# Patient Record
Sex: Female | Born: 1985 | Race: White | Hispanic: No | Smoking: Never smoker
Health system: Southern US, Academic
[De-identification: ages and names within clinical notes are randomized; demographics above are authoritative.]

## PROBLEM LIST (undated history)

## (undated) DIAGNOSIS — E039 Hypothyroidism, unspecified: Secondary | ICD-10-CM

## (undated) DIAGNOSIS — I10 Essential (primary) hypertension: Secondary | ICD-10-CM

## (undated) DIAGNOSIS — N200 Calculus of kidney: Secondary | ICD-10-CM

## (undated) DIAGNOSIS — M1990 Unspecified osteoarthritis, unspecified site: Secondary | ICD-10-CM

## (undated) DIAGNOSIS — M539 Dorsopathy, unspecified: Secondary | ICD-10-CM

## (undated) DIAGNOSIS — E785 Hyperlipidemia, unspecified: Secondary | ICD-10-CM

## (undated) DIAGNOSIS — Z973 Presence of spectacles and contact lenses: Secondary | ICD-10-CM

## (undated) DIAGNOSIS — D689 Coagulation defect, unspecified: Secondary | ICD-10-CM

## (undated) DIAGNOSIS — G8929 Other chronic pain: Secondary | ICD-10-CM

## (undated) DIAGNOSIS — F419 Anxiety disorder, unspecified: Secondary | ICD-10-CM

## (undated) DIAGNOSIS — M329 Systemic lupus erythematosus, unspecified: Secondary | ICD-10-CM

## (undated) DIAGNOSIS — T753XXA Motion sickness, initial encounter: Secondary | ICD-10-CM

## (undated) HISTORY — PX: HX ENDOMETRIAL ABLATION: 2100001129

## (undated) HISTORY — PX: HX PELVIC LAPAROSCOPY: SHX162

## (undated) HISTORY — PX: LAPAROSCOPIC CHOLECYSTECTOMY: SUR755

## (undated) NOTE — Progress Notes (Signed)
 Formatting of this note might be different from the original.  Subjective   Patient ID: Jennifer Lindsey is a 15 y.o. female presenting to the Urgent Care with a chief complaint of Foot Pain (R foot pain after playing basketball yesterday).    Patient complains of lateral right foot pain after playing basketball last night.    History provided by:  Patient    Objective   BP 124/78 (BP Location: Right arm, Patient Position: Sitting, BP Cuff Size: Adult)   Pulse 77   Temp 36.9 C (98.5 F) (Oral)   Resp 16   Ht 1.651 m (5' 5)   Wt 61.2 kg (135 lb)   LMP  (Approximate)   SpO2 98%   BMI 22.47 kg/m     Physical Exam  Vitals and nursing note reviewed.   Constitutional:       Appearance: Normal appearance. She is normal weight.   Musculoskeletal:         General: Tenderness and signs of injury present.      Comments: Tenderness on lateral aspect of right foot   Neurological:      Mental Status: She is alert.         Assessment & Plan    Assessment & Plan  Acute pain of right foot    Orders:    XR foot 3+ views right        In-House Lab Results:   No results found for this or any previous visit.     In-House Imaging Reads:    3 views of right foot reviewed and show no acute findings.      Procedure Documentation:  Procedures     ED Course & MDM   MDM - Medical Decision Making: Home with return precautions  Electronically signed by Avel Larve, PA-C at 06/01/2024  8:49 AM EDT

---
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
## 2004-12-10 ENCOUNTER — Other Ambulatory Visit (HOSPITAL_COMMUNITY): Payer: Self-pay

## 2005-05-06 ENCOUNTER — Ambulatory Visit (INDEPENDENT_AMBULATORY_CARE_PROVIDER_SITE_OTHER): Payer: Self-pay | Admitting: Rheumatology

## 2005-12-08 ENCOUNTER — Ambulatory Visit (INDEPENDENT_AMBULATORY_CARE_PROVIDER_SITE_OTHER): Payer: Self-pay | Admitting: Rheumatology

## 2005-12-31 ENCOUNTER — Encounter (FREE_STANDING_LABORATORY_FACILITY): Payer: Self-pay | Admitting: Pathology

## 2006-06-22 ENCOUNTER — Ambulatory Visit (INDEPENDENT_AMBULATORY_CARE_PROVIDER_SITE_OTHER): Payer: Self-pay | Admitting: Rheumatology

## 2006-07-20 ENCOUNTER — Ambulatory Visit (HOSPITAL_COMMUNITY): Payer: Self-pay

## 2006-07-21 ENCOUNTER — Other Ambulatory Visit: Payer: Self-pay | Admitting: FAMILY PRACTICE

## 2006-07-21 ENCOUNTER — Ambulatory Visit (INDEPENDENT_AMBULATORY_CARE_PROVIDER_SITE_OTHER): Payer: Self-pay | Admitting: Rheumatology

## 2006-12-14 ENCOUNTER — Encounter (FREE_STANDING_LABORATORY_FACILITY): Admit: 2006-12-14 | Discharge: 2006-12-14 | Disposition: A | Discharge status: Home / Self Care | Payer: Self-pay

## 2006-12-20 ENCOUNTER — Encounter (INDEPENDENT_AMBULATORY_CARE_PROVIDER_SITE_OTHER): Payer: Self-pay | Admitting: Rheumatology

## 2006-12-20 ENCOUNTER — Ambulatory Visit
Admission: RE | Admit: 2006-12-20 | Discharge: 2006-12-20 | Disposition: A | Discharge status: Home / Self Care | Payer: No Typology Code available for payment source | Attending: Rheumatology | Admitting: Rheumatology

## 2006-12-20 DIAGNOSIS — M2550 Pain in unspecified joint: Secondary | ICD-10-CM

## 2006-12-20 DIAGNOSIS — I7381 Erythromelalgia: Secondary | ICD-10-CM

## 2006-12-20 DIAGNOSIS — I7300 Raynaud's syndrome without gangrene: Secondary | ICD-10-CM

## 2007-01-23 ENCOUNTER — Encounter (FREE_STANDING_LABORATORY_FACILITY): Admit: 2007-01-23 | Discharge: 2007-01-23 | Disposition: A | Discharge status: Home / Self Care | Payer: Self-pay

## 2007-01-23 DIAGNOSIS — N390 Urinary tract infection, site not specified: Secondary | ICD-10-CM | POA: Diagnosis present

## 2007-03-30 ENCOUNTER — Encounter (INDEPENDENT_AMBULATORY_CARE_PROVIDER_SITE_OTHER): Payer: No Typology Code available for payment source | Admitting: Rheumatology

## 2007-03-30 ENCOUNTER — Other Ambulatory Visit: Payer: Self-pay | Admitting: FAMILY PRACTICE

## 2007-03-30 DIAGNOSIS — I7381 Erythromelalgia: Secondary | ICD-10-CM

## 2007-07-05 ENCOUNTER — Encounter (INDEPENDENT_AMBULATORY_CARE_PROVIDER_SITE_OTHER): Payer: No Typology Code available for payment source | Admitting: Rheumatology

## 2007-07-24 ENCOUNTER — Ambulatory Visit
Admission: RE | Admit: 2007-07-24 | Discharge: 2007-07-24 | Disposition: A | Discharge status: Home / Self Care | Payer: No Typology Code available for payment source | Attending: FAMILY PRACTICE | Admitting: FAMILY PRACTICE

## 2007-07-24 ENCOUNTER — Encounter (INDEPENDENT_AMBULATORY_CARE_PROVIDER_SITE_OTHER): Payer: No Typology Code available for payment source | Admitting: Rheumatology

## 2007-07-24 DIAGNOSIS — Z79899 Other long term (current) drug therapy: Secondary | ICD-10-CM | POA: Insufficient documentation

## 2007-07-24 DIAGNOSIS — I7300 Raynaud's syndrome without gangrene: Secondary | ICD-10-CM

## 2007-07-24 DIAGNOSIS — Z23 Encounter for immunization: Secondary | ICD-10-CM

## 2007-08-29 LAB — CBC/DIFF
BASOPHILS: 1 % (ref 0–1)
BASOS ABS: 0.049 10*3/uL (ref 0.0–0.2)
EOS ABS: 0.046 THOU/uL — ABNORMAL LOW (ref 0.1–0.3)
EOSINOPHIL: 1 % (ref 1–6)
HCT: 43.3 % (ref 33.5–45.2)
HGB: 14.5 g/dL (ref 11.2–15.2)
LYMPHOCYTES: 26 % (ref 20–45)
LYMPHS ABS: 1.31 10*3/uL (ref 1.0–4.8)
MCH: 29.7 pg (ref 27.4–33.0)
MCHC: 33.6 g/dL (ref 31.6–35.5)
MCV: 88.5 fL (ref 78–100)
MONOCYTES: 8 % (ref 4–13)
MONOS ABS: 0.403 10*3/uL (ref 0.1–0.9)
MPV: 8.3 FL (ref 7.4–10.4)
PLATELET COUNT: 234 10*3/uL (ref 140–450)
PMN ABS: 3.22 10*3/uL (ref 1.3–7.7)
PMN'S: 64 % (ref 40–75)
RBC: 4.89 MIL/uL (ref 3.84–5.04)
RDW: 11.1 % — ABNORMAL LOW (ref 11.5–14.5)
WBC: 5 10*3/uL (ref 3.5–11.0)

## 2007-08-29 LAB — CRYOFIBRINOGEN,PLASMA: CRYOFIBRINOGEN: NEGATIVE

## 2007-08-29 LAB — CREATININE
CREATININE: 0.73 mg/dL (ref 0.49–1.10)
ESTIMATED GLOMERULAR FILTRATION RATE: >59 mL/min/{1.73_m2} (ref >59)

## 2007-08-29 LAB — SEDIMENTATION RATE: SEDIMENTATION RATE: 2 mm/h (ref 0–20)

## 2007-11-15 ENCOUNTER — Ambulatory Visit
Admission: RE | Admit: 2007-11-15 | Discharge: 2007-11-15 | Disposition: A | Discharge status: Home / Self Care | Payer: No Typology Code available for payment source | Attending: Rheumatology | Admitting: Rheumatology

## 2007-11-15 ENCOUNTER — Ambulatory Visit (INDEPENDENT_AMBULATORY_CARE_PROVIDER_SITE_OTHER): Payer: No Typology Code available for payment source | Admitting: Rheumatology

## 2007-11-15 DIAGNOSIS — M2550 Pain in unspecified joint: Secondary | ICD-10-CM

## 2007-11-15 LAB — ANTI-DOUBLE STRANDED DNA AB: ANTI-DNA AB: NEGATIVE

## 2007-11-15 LAB — CBC/DIFF
BASOPHILS: 1 % (ref 0–1)
BASOS ABS: 0.032 10*3/uL (ref 0.0–0.2)
EOS ABS: 0.133 10*3/uL (ref 0.1–0.3)
EOSINOPHIL: 3 % (ref 1–6)
HCT: 42 % (ref 33.5–45.2)
HGB: 13.7 g/dL (ref 11.2–15.2)
LYMPHS ABS: 1.29 10*3/uL (ref 1.0–4.8)
MCH: 29.2 pg (ref 27.4–33.0)
MCHC: 32.5 g/dL (ref 31.6–35.5)
MCV: 89.7 fL (ref 78–100)
MONOCYTES: 9 % (ref 4–13)
MONOS ABS: 0.396 10*3/uL (ref 0.1–0.9)
MPV: 7.8 FL (ref 7.4–10.4)
PLATELET COUNT: 239 10*3/uL (ref 140–450)
PMN ABS: 2.37 10*3/uL (ref 1.3–7.7)
RBC: 4.68 MIL/uL (ref 3.84–5.04)
RDW: 11.6 % (ref 11.5–14.5)
WBC: 4.2 THOU/UL (ref 3.5–11.0)

## 2007-11-15 LAB — URINALYSIS, MACROSCOPIC
BILIRUBIN: NEGATIVE
GLUCOSE: NEGATIVE mg/dL
KETONES: NEGATIVE mg/dL
NITRITE: NEGATIVE
PH URINE: 6 (ref 5.0–8.0)
PROTEIN: 30 mg/dL — AB
SPECIFIC GRAVITY, URINE: >=1.03 (ref 1.005–1.030)
UROBILINOGEN: 0.2 mg/dL

## 2007-11-15 LAB — ALT (SGPT): ALT (SGPT): 12 U/L (ref 6–35)

## 2007-11-15 LAB — URINALYSIS, MICROSCOPIC
BACTERIA: NORMAL /HPF (ref 0–493.0)
CASTS: NONE SEEN /LPF (ref 0–2.5)
RBC'S: 10 /HPF (ref 0–3.8)
WBC'S: 0 /HPF (ref 0–5.6)

## 2007-11-15 LAB — CREATININE
CREATININE: 0.8 mg/dL (ref 0.49–1.10)
ESTIMATED GLOMERULAR FILTRATION RATE: >59 mL/min/{1.73_m2} (ref >59)

## 2007-11-15 LAB — C4 COMPLEMENT, SERUM: C4: 13 mg/dL — ABNORMAL LOW (ref 20–59)

## 2007-11-15 LAB — SEDIMENTATION RATE: SEDIMENTATION RATE: 0 mm/hr (ref 0–20)

## 2007-11-15 LAB — AST (SGOT): AST (SGOT): 21 U/L (ref 5–30)

## 2007-11-15 LAB — C-REACTIVE PROTEIN(CRP),INFLAMMATION: C-REACTIVE PROTEIN HIGH SENSITIVITY (INFLAMMATION): 0.02 mg/dL (ref <0.800)

## 2007-11-15 LAB — C3 COMPLEMENT, SERUM: C3: 82 mg/dL — ABNORMAL LOW (ref 86–184)

## 2007-11-15 MED ORDER — OXAPROZIN 600 MG TABLET
1200.0000 mg | ORAL_TABLET | Freq: Every day | ORAL | Status: DC
Start: 2007-11-15 — End: 2015-12-22

## 2007-12-01 NOTE — Progress Notes (Signed)
[facility]  Physician [facility]  [address]  [place], [place] [address]  [phone]    PROGRESS NOTE    PATIENT NAME: Jennifer Lindsey, Jennifer Lindsey  CHART NUMBER: [id number]  DATE OF BIRTH: 06-29-1986  DATE OF SERVICE: 11/15/2007    SUBJECTIVE: The patient returns today to follow up. She complains that she has not been doing well. She relates that in late 08/2007 while home in [place], she developed knots on her knuckles and toes. She had these for about 2 weeks associated with joint pain. She basically hurt all over. She went to the emergency room and was given Toradol, Medrol Dosepak. Her symptoms improved over the course of about 3 days. About 2 weeks ago she noted knots on her fingers again. She again notes joint pain. She also says that when she gets these knots on her PIPs and toes, she will get a rash below the nodules on the dorsum of her fingers and on the tops of her toes. She discontinued Plaquenil in December because she did not think it was helping. She is taking 15 mg of Mobic today and she does not feel it helps. She in the past has tried ibuprofen and naproxen. The patient tells me she went to the [facility] after this episode in December for evaluation. She said they did blood work and she was told that everything was normal. She tells me that she was told that it "might be in my head."    OBJECTIVE: Temperature was 36.4 degrees centigrade, her pulse was 80, her respiratory rate was 16, and her blood pressure was 110/72. Her weight is 112.6 pounds. She is in no acute distress. She did not have any facial rash. Conjunctivae were clear. She had no frank synovitis in the hands, wrists, elbows, knees, ankles or feet. I really could not appreciate any rash on her hands nor could I do not really appreciate any nodules. She did feel that she had some on a couple of PIPs, but she did say that they are improving. She did note some residual rash over the right fifth toe and I did note some slight  hyperpigmentation with very minimal scale in that area.    LABORATORY DATA: Studies from 07/24/2007 were reviewed. Her white count was 5, her hemoglobin was 14.5. Her platelet count was 237,000. Sedimentation rate was 2 mm per hour, creatinine was 0.73 and her cryofibrinogen was negative.    ASSESSMENT:  1. Raynaud phenomenon by history.  2. Arthralgias with complaints of nodules and rash occurring intermittently of unclear etiology.  3. 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    PLAN: We will switch her Mobic to Daypro 1200 mg in the morning. We will check labs today and will call her with the results.      Alwyn Pea, MD  Associate Professor, Section of Rheumatology  [facility]    [id number]; D: 11/15/2007 10:40:15; T: 11/18/2007 10:05:27    cc: Emilie Rutter MD   [facility] [address]   [place], [place] [address]

## 2007-12-06 ENCOUNTER — Ambulatory Visit
Admission: RE | Admit: 2007-12-06 | Discharge: 2007-12-06 | Disposition: A | Discharge status: Home / Self Care | Payer: No Typology Code available for payment source | Attending: Rheumatology | Admitting: Rheumatology

## 2007-12-06 DIAGNOSIS — R319 Hematuria, unspecified: Secondary | ICD-10-CM | POA: Insufficient documentation

## 2007-12-06 LAB — URINALYSIS, MACROSCOPIC
BILIRUBIN: NEGATIVE
BLOOD: NEGATIVE
GLUCOSE: NEGATIVE mg/dL
KETONES: NEGATIVE mg/dL
LEUKOCYTES: NEGATIVE
NITRITE: NEGATIVE
PH URINE: 5.5 (ref 5.0–8.0)
PROTEIN: NEGATIVE mg/dL
SPECIFIC GRAVITY, URINE: 1.025 (ref 1.005–1.030)
UROBILINOGEN: 0.2 mg/dL

## 2007-12-08 LAB — URINE CULTURE,ROUTINE
CULTURE OBSERVATION: NO GROWTH
REPORT STATUS: 4032009

## 2008-02-29 ENCOUNTER — Encounter (INDEPENDENT_AMBULATORY_CARE_PROVIDER_SITE_OTHER): Payer: No Typology Code available for payment source | Admitting: Rheumatology

## 2008-03-19 ENCOUNTER — Encounter (FREE_STANDING_LABORATORY_FACILITY)
Admit: 2008-03-19 | Discharge: 2008-03-19 | Disposition: A | Discharge status: Home / Self Care | Payer: No Typology Code available for payment source | Attending: PHYSICIAN ASSISTANT | Admitting: PHYSICIAN ASSISTANT

## 2008-03-19 DIAGNOSIS — R110 Nausea: Secondary | ICD-10-CM | POA: Diagnosis present

## 2008-03-19 DIAGNOSIS — R42 Dizziness and giddiness: Secondary | ICD-10-CM | POA: Diagnosis present

## 2008-03-19 LAB — CBC/DIFF
BASOPHILS: 1 % (ref 0–1)
BASOS ABS: 0.043 THOU/uL (ref 0.0–0.2)
EOS ABS: 0.16 THOU/uL (ref 0.1–0.3)
EOSINOPHIL: 2 % (ref 1–6)
HCT: 39.9 % (ref 33.5–45.2)
HGB: 13.2 g/dL (ref 11.2–15.2)
LYMPHOCYTES: 25 % (ref 20–45)
LYMPHS ABS: 1.82 THOU/uL (ref 1.0–4.8)
MCH: 29.4 pg (ref 27.4–33.0)
MCHC: 32.9 g/dL (ref 31.6–35.5)
MCV: 89.4 fL (ref 78–100)
MONOCYTES: 8 % (ref 4–13)
MONOS ABS: 0.607 THOU/uL (ref 0.1–0.9)
MPV: 8.3 FL (ref 7.4–10.4)
PLATELET COUNT: 231 THO/UL (ref 140–450)
PMN ABS: 4.7 THOU/uL (ref 1.3–7.7)
PMN'S: 64 % (ref 40–75)
RBC: 4.47 MIL/uL (ref 3.84–5.04)
RDW: 11.9 % (ref 11.5–14.5)
WBC: 7.3 THOU/UL (ref 3.5–11.0)

## 2008-03-19 LAB — COMPREHENSIVE METABOLIC PANEL, NON-FASTING
ANION GAP: 7 mmol/L (ref 5–16)
BUN: 12 mg/dL (ref 6–20)
CALCIUM: 9.2 mg/dL (ref 8.5–10.4)
CARBON DIOXIDE: 27 mmol/L (ref 22–32)
CHLORIDE: 105 mmol/L (ref 96–111)
CREATININE: 0.69 mg/dL (ref 0.49–1.10)
ESTIMATED GLOMERULAR FILTRATION RATE: >59 ml/min/1.73m2 (ref >59)
GLUCOSE,NONFAST: 88 mg/dL (ref 65–139)
POTASSIUM: 4 mmol/L (ref 3.5–5.1)
SODIUM: 139 mmol/L (ref 136–145)

## 2008-05-02 ENCOUNTER — Encounter (INDEPENDENT_AMBULATORY_CARE_PROVIDER_SITE_OTHER): Payer: No Typology Code available for payment source | Admitting: Rheumatology

## 2008-05-02 ENCOUNTER — Ambulatory Visit
Admission: RE | Admit: 2008-05-02 | Discharge: 2008-05-02 | Disposition: A | Discharge status: Home / Self Care | Payer: No Typology Code available for payment source | Attending: Rheumatology | Admitting: Rheumatology

## 2008-05-02 DIAGNOSIS — M2550 Pain in unspecified joint: Secondary | ICD-10-CM | POA: Insufficient documentation

## 2008-05-02 DIAGNOSIS — R6889 Other general symptoms and signs: Secondary | ICD-10-CM

## 2008-05-02 DIAGNOSIS — R894 Abnormal immunological findings in specimens from other organs, systems and tissues: Secondary | ICD-10-CM | POA: Insufficient documentation

## 2008-05-02 DIAGNOSIS — E039 Hypothyroidism, unspecified: Secondary | ICD-10-CM | POA: Insufficient documentation

## 2008-05-02 LAB — URINALYSIS, MICROSCOPIC
RBC'S: 1 /HPF (ref 0–4)
WBC'S: 2 /HPF (ref 0–6)

## 2008-05-02 LAB — URINALYSIS, MACROSCOPIC
BILIRUBIN: NEGATIVE
BLOOD: NEGATIVE
GLUCOSE: NEGATIVE mg/dL
KETONES: NEGATIVE mg/dL
LEUKOCYTES: NEGATIVE
NITRITE: NEGATIVE
PH URINE: 6.5 (ref 5.0–8.0)
SPECIFIC GRAVITY, URINE: 1.022 (ref 1.005–1.030)
UROBILINOGEN: NORMAL mg/dL

## 2008-05-02 LAB — RHEUMATOID FACTOR, SERUM: RHEUMATOID FACTOR: <20 [IU]/mL (ref <20)

## 2008-05-02 LAB — SEDIMENTATION RATE: SEDIMENTATION RATE: 0 mm/h (ref 0–20)

## 2008-05-02 LAB — THYROID STIMULATING HORMONE (SENSITIVE TSH): TSH: 5.729 u[IU]/mL (ref 0.300–5.900)

## 2008-05-03 LAB — C4 COMPLEMENT, SERUM: C4: 12 mg/dL — ABNORMAL LOW (ref 20–59)

## 2008-05-03 LAB — ANTI-DOUBLE STRANDED DNA AB: ANTI-DNA AB: 1:10 {titer}

## 2008-05-03 LAB — C3 COMPLEMENT, SERUM: C3: 84 mg/dL — ABNORMAL LOW (ref 86–184)

## 2008-05-03 LAB — HEP-2 SUBSTRATE ANTINUCLEAR ANTIBODIES (ANA), SERUM: ANTI-NUCLEAR AB: NEGATIVE

## 2008-05-05 LAB — SS-A/RO ANTIBODIES, IGG, SERUM: ANTI-SSA AB, QUANTITATIVE: 6 U

## 2008-05-05 LAB — SM (SMITH) ANTIBODIES, IGG, SERUM: ANTI-SM AB, QUANTITATIVE: 1 U

## 2008-05-05 LAB — SS-B/LA ANTIBODIES, IGG, SERUM: ANTI-SSB AB, QUANTITATIVE: 0 U

## 2008-05-05 LAB — RNP ANTIBODIES, IGG, SERUM: RIBOSOMAL PROTEIN AB, IGG (U1) QUANTITATIVE: 3 U

## 2008-05-07 LAB — CYCLIC CITRULLINATED PEPTIDE ANTIBODIES, IGG, SERUM: CYCLIC CITRULLINATED PEP IgG: 13

## 2008-05-09 ENCOUNTER — Ambulatory Visit (INDEPENDENT_AMBULATORY_CARE_PROVIDER_SITE_OTHER): Payer: Self-pay | Admitting: Rheumatology

## 2008-05-09 NOTE — Telephone Encounter (Signed)
Triage Queue message copied by Ileene Hutchinson on Thu May 09, 2008 12:35 PM  ------   Message from: Durenda Age   Created: Thu May 09, 2008 11:05 AM    >> Durenda Age Thu May 09, 2008 11:05 am  DR.hORNSBY  PT NEEDS LAB RESULTS THAT WERE DONE LAST Thursday.PLEASE CALL PT BACK [phone] IF NO ANSWER PLEASE LEAVE MESSAGE

## 2008-05-10 MED ORDER — CYCLOBENZAPRINE 10 MG TABLET
10.00 mg | ORAL_TABLET | Freq: Every evening | ORAL | Status: DC
Start: 2008-05-10 — End: 2008-09-12

## 2008-05-10 NOTE — Telephone Encounter (Signed)
Gave pt results  Sleeps okay but not rested  Query FMS  Will try flexeril 5-10 mg qhs  She will call with response  Mail pt copy of labs please

## 2008-05-10 NOTE — Telephone Encounter (Signed)
Copy of labs mailed to pt.

## 2008-05-17 ENCOUNTER — Ambulatory Visit (INDEPENDENT_AMBULATORY_CARE_PROVIDER_SITE_OTHER): Payer: Self-pay | Admitting: Rheumatology

## 2008-05-17 ENCOUNTER — Encounter (INDEPENDENT_AMBULATORY_CARE_PROVIDER_SITE_OTHER): Payer: Self-pay | Admitting: Rheumatology

## 2008-05-17 NOTE — Telephone Encounter (Signed)
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 Any questions please call patient at [phone].

## 2008-05-17 NOTE — Telephone Encounter (Signed)
done

## 2008-05-20 ENCOUNTER — Ambulatory Visit (INDEPENDENT_AMBULATORY_CARE_PROVIDER_SITE_OTHER): Payer: Self-pay | Admitting: Rheumatology

## 2008-05-20 NOTE — Telephone Encounter (Signed)
Triage Queue message copied by Baker Pierini on Mon May 20, 2008 1:22 PM  ------   Message from: Venia Carbon   Created: Mon May 20, 2008 1:18 PM    >> Venia Carbon Mon May 20, 2008 1:18 pm  DR [facility]  PT NEEDS THE LETTER FOR [employer] FAXED TO [phone], ATTENTION CLARKE RIDGWAY. PLEASE CALL THE PT WHEN THIS IS FAXED.

## 2008-05-20 NOTE — Telephone Encounter (Signed)
Letter printed  Will leave at nurses station desk

## 2008-05-21 NOTE — Progress Notes (Signed)
[facility]  Physician [facility]  [address]  [place], [place] [address]  [phone]    PROGRESS NOTE    PATIENT NAME: Jennifer Lindsey, Jennifer Lindsey  CHART NUMBER: [id number]  DATE OF BIRTH: 04/14/1986  DATE OF SERVICE: 05/02/2008    SUBJECTIVE: Ms. Jennifer Lindsey presents today to follow up her history of Raynaud's and erythromelalgia, as well as a positive ANA. She notes continued diffuse joint pain. She says the Daypro 600 mg two a day helps, but she still has diffuse discomfort. She feels that her PIP joints occasionally gets swollen. She notes that she has diffuse swelling in her hands when they are dependent. Her Raynaud's is unchanged. She was out in the sun and had some blisters on her hands as well as a rash on her nose and face with sun exposure. The redness in her entire hand is better with cooling down and warmth makes it worse.    OBJECTIVE: Physical exam revealed a temperature of 37 degrees centigrade, pulse of 80, respiratory rate of 16 and a blood pressure of 124/76. Weight 126.4 pounds. She is in no acute distress. She did not have any fibromyalgia tender points. She had no active synovitis in the upper or lower extremities. Lungs were clear. Heart was regular.    ASSESSMENT:   1. Polyarthralgia.  2. Positive ANA.  3. Erythromelalgia  4. Raynaud's.    PLAN: We will reevaluate with laboratory studies and I will call her with the results. We will also check her thyroid as she now has a new diagnosis of hypothyroidism and is due to have her TSH rechecked.      Alwyn Pea, MD  Associate Professor, Section of Rheumatology  [facility] Department of Medicine    [id number]; D: 05/02/2008 15:56:33; T: 05/06/2008 05:47:26    cc: Enclosure: Lab Results          Emilia Beck DO   [address]    [place], [place] [address]

## 2008-05-21 NOTE — Telephone Encounter (Signed)
 Letter faxed to [employer] and message left on pts voicemail.

## 2008-08-12 ENCOUNTER — Encounter (INDEPENDENT_AMBULATORY_CARE_PROVIDER_SITE_OTHER): Payer: No Typology Code available for payment source | Admitting: Rheumatology

## 2008-09-12 ENCOUNTER — Other Ambulatory Visit (INDEPENDENT_AMBULATORY_CARE_PROVIDER_SITE_OTHER): Payer: Self-pay | Admitting: Rheumatology

## 2008-09-12 MED ORDER — CYCLOBENZAPRINE 10 MG TABLET
10.00 mg | ORAL_TABLET | Freq: Every evening | ORAL | Status: AC
Start: 2008-09-12 — End: ?

## 2008-09-12 NOTE — Telephone Encounter (Signed)
Triage Queue message copied by Baker Pierini on Thu Sep 12, 2008 7:55 AM  ------   Message from: Gibson Ramp   Created: Wed Sep 11, 2008 3:42 PM    >> Ali Lowe Sep 11, 2008 3:42 pm  Dr.Hornsby  The pt is requesting that you call in an rx for flexeril 10mg , she takes the meds 1 time per day

## 2008-12-18 ENCOUNTER — Encounter (INDEPENDENT_AMBULATORY_CARE_PROVIDER_SITE_OTHER): Payer: No Typology Code available for payment source | Admitting: Rheumatology

## 2008-12-18 ENCOUNTER — Encounter (INDEPENDENT_AMBULATORY_CARE_PROVIDER_SITE_OTHER): Payer: Self-pay | Admitting: Rheumatology

## 2008-12-18 ENCOUNTER — Ambulatory Visit (INDEPENDENT_AMBULATORY_CARE_PROVIDER_SITE_OTHER): Payer: No Typology Code available for payment source | Admitting: Rheumatology

## 2008-12-18 ENCOUNTER — Ambulatory Visit
Admission: RE | Admit: 2008-12-18 | Discharge: 2008-12-18 | Disposition: A | Discharge status: Home / Self Care | Payer: No Typology Code available for payment source | Attending: Rheumatology | Admitting: Rheumatology

## 2008-12-18 DIAGNOSIS — R21 Rash and other nonspecific skin eruption: Secondary | ICD-10-CM | POA: Insufficient documentation

## 2008-12-18 DIAGNOSIS — M2550 Pain in unspecified joint: Secondary | ICD-10-CM

## 2008-12-18 LAB — C-REACTIVE PROTEIN(CRP),INFLAMMATION: C-REACTIVE PROTEIN (CRP),INFLAMMATION: 0.102 mg/dL (ref <0.800)

## 2008-12-18 LAB — RHEUMATOID FACTOR, SERUM: RHEUMATOID FACTOR: <20 [IU]/mL (ref <20)

## 2008-12-18 LAB — SEDIMENTATION RATE: SEDIMENTATION RATE: 5 mm/h (ref 0–20)

## 2008-12-19 LAB — C4 COMPLEMENT, SERUM: C4: 18 mg/dL — ABNORMAL LOW (ref 20–59)

## 2008-12-19 LAB — C3 COMPLEMENT, SERUM: C3: 114 mg/dL (ref 86–184)

## 2008-12-19 NOTE — Progress Notes (Signed)
Subjective  Jennifer Lindsey is a 23 y.o. year old female who presents for Follow-up   to clinic. The patient complains of diffuse joint discomfort. Proximally one month ago she developed a fever of 102 and was admitted in [place] for suspected pyelonephritis. She was treated with Bactrim. At that time she also had associated joint pain. She saw her primary care provider after discharge and was given Valtrex and prednisone 5 mg a day for about 1-1/2 weeks. This helped her symptoms after 2 days. She has been off of the prednisone for about 2-1/2 weeks. She still has joint pain but it is less severe. She is not currently having any fevers. Her joint pain is located in her hands, wrists, lateral thighs and ankles. She also feels that her great toes and her PIP joints well. She denies morning stiffness. She has noted a rash on her hands today. She notes she developed a rash on her second PIP. She does not think the Daypro significantly helped. She says there is no change in her erythromelalgia or Raynaud's phenomenon.     Objective    Vitals: BP 130/90  Pulse 90  Temp 36.5 C (97.7 F)  Ht 1.664 m (5' 5.5")  Wt 56.337 kg (124 lb 3.2 oz)  SpO2 100%  General:  no acute distress  Eyes: Conjunctiva clear.  Joints:  Full ROM and no synovitis of the upper or lower extremities  Skin: There were small approximately 2 mm red papules on her hands.    Laboratory studies dated 313 2010 showed white count of 9.6, hemoglobin 13.8 and a platelet count 158,000. Glucose was 100, creatinine was 0.8, C. reactive protein was 5.1 and CK was 65. TSH was 1.32. Urinalysis showed small blood and 30 mg per deciliter protein. Urine culture was negative after 5 days  Assessment/Plan  Encounter Diagnoses   Code Name Primary? Qualifier   . 782.1R Rash     . 719.40A Joint pain       #1 Raynaud's   #2 erythromelalgia   #3 arthralgias   #4 papular rash  #5 mild elevation in diastolic blood pressure    Etiology of patient's symptoms remain somewhat  unclear. Laboratory studies will be performed today to evaluate further. She will see a dermatologist at home for her rash.      Orders Placed This Encounter   . Sedimentation rate   . C-reactive protein (inflammation)   . C3 complement   . C4 complement   . Anti-ssa ab   . Anti-ssb ab   . Ana   . Anti-rnp antibody   . Rheumatoid factor   . Cyclic citrullinated pep igg         Alexis Frock, MD

## 2008-12-20 LAB — HEP-2 SUBSTRATE ANTINUCLEAR ANTIBODIES (ANA), SERUM: ANTI-NUCLEAR AB: POSITIVE — AB

## 2008-12-21 LAB — SS-B/LA ANTIBODIES, IGG, SERUM: ANTI-SSB AB, QUANTITATIVE: 0 U

## 2008-12-21 LAB — SS-A/RO ANTIBODIES, IGG, SERUM: ANTI-SSA AB, QUANTITATIVE: 3 U

## 2008-12-21 LAB — CYCLIC CITRULLINATED PEPTIDE ANTIBODIES, IGG, SERUM: CYCLIC CITRULLINATED PEP IgG: 6

## 2008-12-21 LAB — RNP ANTIBODIES, IGG, SERUM: RIBOSOMAL PROTEIN AB, IGG (U1) QUANTITATIVE: 0 U

## 2008-12-30 ENCOUNTER — Ambulatory Visit (INDEPENDENT_AMBULATORY_CARE_PROVIDER_SITE_OTHER): Payer: No Typology Code available for payment source | Admitting: Rheumatology

## 2008-12-30 ENCOUNTER — Ambulatory Visit
Admission: RE | Admit: 2008-12-30 | Discharge: 2008-12-30 | Disposition: A | Discharge status: Home / Self Care | Payer: No Typology Code available for payment source | Attending: Rheumatology | Admitting: Rheumatology

## 2008-12-30 VITALS — BP 112/70 | HR 84 | Temp 98.6°F | Resp 16 | Ht 66.0 in | Wt 125.2 lb

## 2008-12-30 DIAGNOSIS — R21 Rash and other nonspecific skin eruption: Secondary | ICD-10-CM | POA: Insufficient documentation

## 2008-12-30 DIAGNOSIS — R894 Abnormal immunological findings in specimens from other organs, systems and tissues: Secondary | ICD-10-CM

## 2008-12-30 DIAGNOSIS — M2550 Pain in unspecified joint: Secondary | ICD-10-CM

## 2008-12-30 NOTE — Progress Notes (Signed)
Subjective  Jennifer Lindsey is a 23 y.o. year old female who presents for Results   to clinic. Patient here to followup her rashes and joint pain positive ANA. She now says she has a rash on the bottoms of both feet which stings a little bit when she gets in the shower. Otherwise she doesn't have any new symptoms since her last visit. 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 She will be moving to [place]. Skin biopsy suggested chilblains.    Objective    Vitals: BP 112/70  Pulse 84  Temp 37 C (98.6 F)  Resp 16  Ht 1.676 m (5\' 6" )  Wt 56.79 kg (125 lb 3.2 oz)  SpO2 94%  General:  no acute distress  Eyes: Conjunctiva clear.  Joints:  Full ROM and no synovitis  Skin: Slightly red raised maculopapular rash in the arches of her feet.  Psychiatric: Alert  Neuro: Grossly intact  Gait: No limp    Assessment/Plan  Encounter Diagnoses   Code Name Primary? Qualifier   . 782.1R Rash Yes    . 719.40A Joint pain     . 795.79AP Positive ANA (antinuclear antibody)       Plan lengthy discussion with the patient and her laboratory studies. We reviewed the fact that her ANA was positive. In the past she did have a trace positive cryofibrinogen and will repeated was negative. She's having symptoms again we have decided to recheck this. She tried Plaquenil before and didn't think that helped. We are considering retrying this for possible chilblains. Will also check her double-stranded DNA    Orders Placed This Encounter   . Cryofibrinogen   . Cryoglobulin   . Anti-double stranded dna ab         Alexis Frock, MD

## 2008-12-31 LAB — ANTI-DOUBLE STRANDED DNA AB: ANTI-DNA AB: 1:10 {titer}

## 2009-01-02 ENCOUNTER — Ambulatory Visit (INDEPENDENT_AMBULATORY_CARE_PROVIDER_SITE_OTHER): Payer: Self-pay | Admitting: Rheumatology

## 2009-01-02 LAB — CRYOFIBRINOGEN,PLASMA: CRYOFIBRINOGEN: POSITIVE — AB

## 2009-01-02 NOTE — Telephone Encounter (Signed)
Triage Queue message copied by Ileene Hutchinson on Thu Jan 02, 2009 2:42 PM  ------   Message from: Burnell Blanks A   Created: Thu Jan 02, 2009 2:30 PM    >> Burnell Blanks A Thu Jan 02, 2009 2:30 pm  Dr Tenna Child pt would like a call regarding her blood work results [facility]

## 2009-01-03 NOTE — Telephone Encounter (Signed)
Called pt  Left message I called

## 2009-01-03 NOTE — Telephone Encounter (Signed)
Gave her results  Recommend hematology evaluation to complete evaluation  The cryofibrinogen is possibly related to an autoimmune disease but other etiologies are possible  She will check to see if someone in her area is available to see her for that  If not she will call next week for referral here

## 2009-01-06 LAB — CRYOGLOBULIN,SERUM: CRYOGLOBULIN: NEGATIVE — AB

## 2009-01-07 ENCOUNTER — Ambulatory Visit (INDEPENDENT_AMBULATORY_CARE_PROVIDER_SITE_OTHER): Payer: Self-pay | Admitting: Rheumatology

## 2009-01-07 ENCOUNTER — Encounter (INDEPENDENT_AMBULATORY_CARE_PROVIDER_SITE_OTHER): Payer: No Typology Code available for payment source | Admitting: Rheumatology

## 2009-01-07 NOTE — Telephone Encounter (Signed)
Triage Queue message copied by Ileene Hutchinson on Tue Jan 07, 2009 12:52 PM  ------   Message from: Ace Gins   Created: Tue Jan 07, 2009 12:46 PM    >> Ace Gins Tue Jan 07, 2009 12:46 pm  DR HORNSBY-Patient is calling to let you know that she is going to see Dr Lynden Ang hemotoogist in [place]. She wouldl ike to talk to Dr Tenna Child because she is expierencing a rash across her face and torso. Please call. Thank you

## 2009-01-10 NOTE — Telephone Encounter (Signed)
Rash on nose and cheeks for three days  Resolved on its on  She will call if it recurs

## 2009-01-13 ENCOUNTER — Ambulatory Visit (INDEPENDENT_AMBULATORY_CARE_PROVIDER_SITE_OTHER): Payer: Self-pay | Admitting: Rheumatology

## 2009-01-13 NOTE — Telephone Encounter (Signed)
Triage Queue message copied by Ileene Hutchinson on Mon Jan 13, 2009 9:38 AM  ------   Message from: Gibson Ramp   Created: Fri Jan 10, 2009 3:51 PM    >> Gibson Ramp Fri Jan 10, 2009 3:51 pm  Dr.Hornsby  The pt is returning your cal, please call her at [phone]

## 2009-01-13 NOTE — Telephone Encounter (Signed)
Triage Queue message copied by Ileene Hutchinson on Mon Jan 13, 2009 1:32 PM  ------   Message from: Angelia Pennisi   Created: Mon Jan 13, 2009 9:58 AM    >> Angelia Fenlon ZOX Jan 13, 2009 9:58 am  Dr Tenna Child, the patient states that Dr Deretha Emory in [place] has been faxing a release form to get some of the patient's records, and they have not received anything back. She is wanting to know if you would take care of this if they send the release to the office fax, because med records is not getting back with Dr Deretha Emory. The patient's appointment is at 11:45 this morning, and they are needing this as soon as possible. If there are any questions the patient can be reached at [phone].

## 2009-01-13 NOTE — Telephone Encounter (Signed)
Called and spoke with pt.  Advised pt that we don't handle the ROI and we are unaware of the her requests.  Advised pt to contact medical records in regard to the ROI and getting the records sent to her PCP.  Pt verbalized understanding. Provided medical records phone number and transferred pt.

## 2009-01-29 ENCOUNTER — Ambulatory Visit (INDEPENDENT_AMBULATORY_CARE_PROVIDER_SITE_OTHER): Payer: Self-pay | Admitting: Rheumatology

## 2009-01-29 NOTE — Telephone Encounter (Signed)
Triage Queue message copied by Baker Pierini on Wed Jan 29, 2009 3:34 PM  ------   Message from: Angelia Hart   Created: Wed Jan 29, 2009 3:13 PM    >> Eldridge Abrahams Jan 29, 2009 3:13 pm  Dr Tenna Child, the patient states that she had testing done in [place] by her Onchcologist, and she was told to contact you to go over the results and find out where to go from there. The patient can be reached at [phone] or [phone].

## 2009-01-30 NOTE — Telephone Encounter (Signed)
Have results sent here

## 2009-01-31 NOTE — Telephone Encounter (Signed)
Called and spoke with pt.  Notified pt we don't have any records from her oncologist here but she can have the records faxed here for dr Tenna Child to go over provided fax number.

## 2009-02-04 NOTE — Telephone Encounter (Signed)
Records received via fax and placed in dr box for review

## 2009-02-11 ENCOUNTER — Ambulatory Visit (INDEPENDENT_AMBULATORY_CARE_PROVIDER_SITE_OTHER): Payer: Self-pay | Admitting: Rheumatology

## 2009-02-11 NOTE — Telephone Encounter (Signed)
Triage Queue message copied by Baker Pierini on Tue Feb 11, 2009 1:18 PM  ------   Message from: Sander Nephew   Created: Tue Feb 11, 2009 1:06 PM    >> Sander Nephew Tue Feb 11, 2009 1:06 pm  DR. HORNSBY: THE PATIENT IS CALLING REGARDING BLOOD WORK RESULTS SHE HAD DONE TWO WEEKS AGO IN [place] - SHE HAS NOT HEARD ANYTHING BACK FROM YOUR OFFICE, REGARDING THE RESULTS AND WOULD LIKE TO DISCUSS THIS ASAP. SHE CAN BE REACHED TODAY AT WORK NUMBER, THEN TRY CELL NUMBER

## 2009-02-11 NOTE — Telephone Encounter (Signed)
Her hematologist wants me to see if I can get her cryofibrinogen quantitaed in our lab and I am checking on that and will call her when I know the answer

## 2009-02-14 NOTE — Telephone Encounter (Signed)
No meds recommended yet pending the results of this test  She can get it now or wait, whatever is more convienent for her

## 2009-02-14 NOTE — Telephone Encounter (Signed)
Called and spoke with pt.  Notified pt dr Tenna Child doesn't recommend any meds until she gets the results of the test.  Pt stated she will wait until her appt June 21.

## 2009-02-24 ENCOUNTER — Ambulatory Visit
Admission: RE | Admit: 2009-02-24 | Discharge: 2009-02-24 | Disposition: A | Discharge status: Home / Self Care | Payer: No Typology Code available for payment source | Attending: Rheumatology | Admitting: Rheumatology

## 2009-03-03 LAB — CRYOFIBRINOGEN,PLASMA: CRYOFIBRINOGEN: NEGATIVE — AB

## 2009-03-04 ENCOUNTER — Ambulatory Visit (INDEPENDENT_AMBULATORY_CARE_PROVIDER_SITE_OTHER): Payer: Self-pay | Admitting: Rheumatology

## 2009-03-04 NOTE — Telephone Encounter (Signed)
Triage Queue message copied by Baker Pierini on Tue Mar 04, 2009 2:53 PM  ------   Message from: Michel Bickers   Created: Tue Mar 04, 2009 2:19 PM    >> Michel Bickers Tue Mar 04, 2009 2:19 pm  Dr Tenna Child,    Patient would like her blood work results.

## 2009-03-04 NOTE — Telephone Encounter (Signed)
Phoned pt and gave her Levan Hurst message.

## 2009-03-04 NOTE — Telephone Encounter (Signed)
Please call patient and tell her that the cryofibrinogen was negative so the first test did not mean anything. THanks

## 2009-03-31 ENCOUNTER — Encounter (INDEPENDENT_AMBULATORY_CARE_PROVIDER_SITE_OTHER): Payer: No Typology Code available for payment source | Admitting: Rheumatology

## 2011-02-13 NOTE — Letter (Signed)
[facility]  Physician [facility]  [address]  [place], [place] [address]  [phone]    PATIENT NAME: Jennifer Lindsey, Jennifer Lindsey  CHART NUMBER: [id number]  DATE OF BIRTH: 03-10-1986  DATE OF SERVICE: 05/17/2008    May 17, 2008      Jennifer Lindsey  [address]  [place], [place]  [address]    Dear Ms. Walko:     I am writing this letter at your request.  You have asked for a letter outlining your diagnosis and expected length of condition.    You have a history of Raynaud's phenomenon, erythromelalgia and a positive ANA.  You also have polyarthralgias and a diagnosis of hypothyroidism.  These are long-term diagnoses.    If you have any questions, please do not hesitate to contact me.    Sincerely yours,      Alwyn Pea, MD  Associate Professor, Section of Rheumatology  [facility] Department of Medicine    [id number]; D: 05/17/2008 10:32:18; T: 05/20/2008 09:42:33

## 2014-07-09 DIAGNOSIS — D8989 Other specified disorders involving the immune mechanism, not elsewhere classified: Secondary | ICD-10-CM | POA: Insufficient documentation

## 2014-07-09 DIAGNOSIS — N979 Female infertility, unspecified: Secondary | ICD-10-CM | POA: Insufficient documentation

## 2014-07-09 DIAGNOSIS — N971 Female infertility of tubal origin: Secondary | ICD-10-CM | POA: Insufficient documentation

## 2014-07-09 DIAGNOSIS — E039 Hypothyroidism, unspecified: Secondary | ICD-10-CM | POA: Insufficient documentation

## 2014-07-09 DIAGNOSIS — N809 Endometriosis, unspecified: Secondary | ICD-10-CM | POA: Insufficient documentation

## 2015-12-01 ENCOUNTER — Encounter (HOSPITAL_COMMUNITY): Payer: Self-pay

## 2015-12-01 NOTE — Ancillary Notes (Addendum)
[facility] Record for: [name], Jennifer Lindsey  Created: 11/28/2015 1:05:37 PM  MRN: [id number]  DOB: 09/25/1985  SSN: [id number]  Sex: Female  Height:  Feet  Inches - Weight:   Juanell Fairly Name:   Address:   [address]  [place]: [place], [place] [address]  E-Mail:   Day Phone: [phone] Night Phone:  Other Phone: [phone]  Phone comments: [phone] is a number to a pharmacy please check  Call Back time:   Authorized contact:   Intake Date: 11/28/2015 1:05:37 PM  PCP:    RefMD: Melida Quitter MD, Rosette Reveal Insurance: PEIA  Secondary Insurance:   Insurance Comments: PEIA on file ID  [id number]     -------Referral Assignment------  Where was the original referral directed?: Physician Practice  Was a specific reviewer requested?: Unassigned Referral  How was the reviewer selected?: Rotation  Who requested the specific reviewer?:   How did you hear of our spine program?:   Is this a second opinion?:      -------Web Challenge Info----------  Favorite Color:   City of Birth:      ---------- 1st Review ----------     Review Date:   Review Completed by:   Impression:   Disposition:   Appt w/ Colleague:   Colleague Name:   Appt How Soon:   Pre Treatment Type:   Pre Treatment Type Details:   Pre Treatment Other Test:   Appt Type:   Instructions:      ---------- Symptoms ----------     Chief complaint:   Diagnosis from Other MD:      Symptoms:   Other Symptom Description:   Pain Location:   Other Pain Location Description:   Where is your pain the worst?:   Pain Type:   Pain Rating:   Does the pain radiate to other parts of your body?    Radiate Where:   Other Description:   Does it radiate to the fingers?   Does it radiate below the elbow?   Which specific part of your arm?   Which fingers?   Which part of your arm does pain go to?   How does it radiate to the arm?   Does it radiate to the toes?   Does it radiate below the knee?   Which specific part of your leg?   Which toes?   Which part of your leg does pain go to?      How does it radiate to the leg?   Additional pain information      Location of Numbness/tingling:   Other Description:  Does numbness/tingling radiate to other parts of your body?:  Where does the numbess/tingling radiate?:  Other Description:  Does the numbness/tingling radiate below your elbow?:  Which specific part of your arm?:  Which fingers:  Which pat of your arm does the numbness/tingling go to?:  How does the numbness/tingling radiate to your arm?:  Does the numbness/tingling radiate below your knee?:  Which specific part of your leg?:  Which toes:  Which part of your leg does the numbness/tingling go to?:  How does the numbness/tingling radiate to your leg?:  Additional Numbness/tingling information:  Other Description:      Location of Weakness:   Other Description:   Additional Weakness Information:      The symptoms have been present for:   The symptoms began:   Was there a specific event that caused your symptoms?:   Additional Narrative Description:   Are you able  to perform your daily activities with these symptoms?:   Since what date have you been unable to perform your daily routine?:   The symptoms improve when you:   Other activities that improve your symptoms:   The symptoms worsen when you:   Other activities that worsen your symptoms:      ---------- Work History ----------     Are you able to work?:   Reasons for not working:   Other Reason for not working:   Do you have Work Restrictions:   If applicable, maximum lifting restriction:   How long have you been unable to work:   Have you ever filed a W/C claim related to a neck or back injury?:   Occupation:   Other Occupation Information:      ---------- Bowel/Bladder/Incontinence Issues ----------     Since the onset of symptoms, have you experienced any new problems urinating or having bowel movements?:   Description:   Other Description:   How long have you had these bowel/bladder problems?:      ---------- Allergies ----------     Do you  have any medication allergies?   Allergies:   Other Details:   Allergic to Latex?:   Allergic to intravenous contrast dyes?:   Allergic to steroids?:      ---------- Treatment/Testing ----------      Taking prescription medication for this problem?:   Are you using any now?:   Have you received a Medrol dose pack for this problem?:   When did you last take the dosepack?:   Were your symptoms improved?:   Would you describe your relief as:   How long did you experience that amount of relief?:   Other Medrol dosepack information:      --------------- PT ---------------     PT:   When Received:   Where Received:   Other where received information:   Visits:   Types:   Other Types:   Improved:      If improved, describe level of relief:   If improved, how long did you experience relief:   Other Physical Therapy Treatment Information:      ---------- Chiropractic Services ----------     Chiro:   When:   Who:   Visits:   Types:   Other Types:   Were Symptoms Improved:   If improved, describe level of relief:   If improved, how long did you experience relief:   Other Chiropractic Treatment Information:      --------------- ESI ---------------     ESI:   When:   Who:   Visits:   Types:   Improved:   If improved, describe level of relief:   If improved, how long did you experience relief:   Other Injection Treatment Information:      ---------- Diagnostic Tests ----------     MRI scan On:11/19/14 [facility]  Area Scanned: Thoracic  Abdomen MRI On:11/19/14 [facility]   Do you have any of the following in case an MRI is ordered?:   Other MRI factors:      ---------- Past Medical History ----------     What other doctors/providers have treated you for these spine issues?     Ever diagnosed with Spine deformity?:   Prior neck or back surgery (1)?:   When:   Who:   Area of neck/spine operated on:   Level:   Were symptoms improved?:   If improved, describe level of relief:  If improved,  how long did you experience relief?:   Prior neck or back surgery (2)?:   When:   Who:   Area of neck/spine operated on:   Level:   Were symptoms improved?:   If improved, describe level of relief:  If improved, how long did you experience relief?:   Prior neck or back surgery (3)?:   When:   Who:   Area of neck/spine operated on:   Level:   Were symptoms improved?:   If improved, describe level of relief:  If improved, how long did you experience relief?:   Do you have any of the following assistive devices?   How long have you required these assistive devices?   Currently being treated for any other medical condition?:   Conditions:   Other Conditions:   Type of neurologic disorder:   Cancer Type:   Other Treatment/Medication:   What blood thinners are you currently taking?:   Which physicians are treating you for your other medical conditions?:   Do you smoke?:   Are you pre-menopausal or post-menopausal?:   If recommended, are you willing to consider surgery?:   What is your goal in seeking treatment?:   Other pertinent information/ general comments/ goals for treatment: office notes and report on file DBragg     ---------- Care Coordinator Information ----------     Care Coordinator: RS  Activity Log:      Letter/Test Coordinator: Letters  Letter/Test Coordinator Log: 12/01/2015 8:51:43 AM Aggie Cosier Tephabock]: This referral needs done in the pain clinic per [facility]. I emailed it over to [employer]. I called and confirmed she is here. Ctephabock 12/04/2015 9:24:38 AM [Zarahi Fuerst]: Left message on Dr. Melida Quitter office answering machine to call back to clarify if the referral is for neurosurgery and for pain clinic. CChidester. 12/08/2015 12:06:58 PM Rosey Bath Prince]: Drema Dallas to check the statis of the appointment for the patient. She wasn't in her office I left a message for her to call us back. Tprince  12/08/2015 12:58:38 PM Rosey Bath Prince]: I received a message from Hilda Lias she states they have reached out to the  patient to schedule left a message for her to call back. Tprince  12/10/2015 10:39:30 AM [Trini Soldo]: Patient is scheduled with Dr. Felizardo Hoffmann at [facility] for 12/22/15 at 02:20 pm. CChidester.     Intake Specialist Comments: 11/28/2015 2:13:34 PM Rosey Bath Prince]: Attempted to contact patient to obtain his/her medical history.  Left message and phone  on answering machine for patient to return our call. First number had a voice mail that wasn't set up,second number was for a pharmacy. Will set to try again Monday. Scarlette Calico     Clinic Staff Comments:      Last Edited byJerolyn Shin on 12/10/2015 10:41:33 AM  Last Review by:  on

## 2015-12-22 ENCOUNTER — Encounter (HOSPITAL_BASED_OUTPATIENT_CLINIC_OR_DEPARTMENT_OTHER): Payer: Self-pay | Admitting: Anesthesiology

## 2015-12-22 ENCOUNTER — Ambulatory Visit: Payer: No Typology Code available for payment source | Attending: Anesthesiology | Admitting: Anesthesiology

## 2015-12-22 ENCOUNTER — Ambulatory Visit (HOSPITAL_BASED_OUTPATIENT_CLINIC_OR_DEPARTMENT_OTHER): Payer: No Typology Code available for payment source

## 2015-12-22 VITALS — BP 136/81 | HR 62 | Temp 98.1°F | Resp 20 | Ht 66.0 in | Wt 133.6 lb

## 2015-12-22 DIAGNOSIS — M47814 Spondylosis without myelopathy or radiculopathy, thoracic region: Secondary | ICD-10-CM

## 2015-12-22 DIAGNOSIS — Z7952 Long term (current) use of systemic steroids: Secondary | ICD-10-CM | POA: Insufficient documentation

## 2015-12-22 DIAGNOSIS — G894 Chronic pain syndrome: Secondary | ICD-10-CM

## 2015-12-22 DIAGNOSIS — M549 Dorsalgia, unspecified: Secondary | ICD-10-CM

## 2015-12-23 NOTE — Progress Notes (Addendum)
[facility]  [facility]  [address]  [address]  [place] [address]  [phone]               [facility] Controlled Substance Full Name Report Report Date 12/23/2015   From 09/07/2015 To 12/22/2015 Date of Birth [birth year] Prescription Count 0   Last Name Lindsey First Name Jennifer Middle Name                  M.E.D.D. Morphine Equivalent Daily Dose  [place] CSAPP/RxDataTrack Patient Reports  This is an Active Cumulative Morphine Equivalent score added to patient reports, when the patient has active prescriptions for opioids. This feature is meant to readily identify the potency comparison among different prescribed opioids as a single score describing the equivalent dose of morphine available to be taken on a daily basis. It is a guide ONLY.    According to CDC, a score of more than 50 requires caution and a score of 90 or more indicates a significant increase in the risk for an overdose.                  Patients included in report that appear to match the search criteria.   Last Name First Name Middle Name Gender Address           This table shows the prescriptions used to compute the MEDD score.           MEDD Score Calculator   RX# Drug Strength Unit  Multiplier  Quantity  Days  MEDD           Cumulative Active Morphine Equivalent                           0           It does not reflect any information on the use of any other type of controlled substance such as benzodiazepines, stimulants, or sedatives. if a patient does not have an active prescription for an opiate no score will be visible on the report.           Prescriber Name Prescriber DEA & Zip Dispenser Name Dispenser DEA & Zip Rx Written Date Rx Dispense Date Rx Number Product Name MEDD Status Strength Qty Days # of Refill Sched Payment Type              Date:12/23/2015  Name: Jennifer Lindsey  Date of Birth:[birth year]    Imagery:  Not available.    BOP:  Today.      Current  Medications (opioids/ being prescribed):    Current Outpatient Prescriptions   Medication Sig    cyclobenzaprine (FLEXERIL) 10 mg Tab take 1 Tab by mouth every night.  (Patient taking differently: Take 10 mg by mouth Every night Pt takes differently, pt takes 1/4 of tablet at night as needed.)    FLUoxetine (PROZAC) 40 mg Oral Capsule Take 40 mg by mouth Once a day    hydroxychloroquine (PLAQUENIL) 200 mg Oral Tablet Take 200 mg by mouth Once a day    levothyroxine (SYNTHROID) 50 mcg Tab Take 112 mcg by mouth Once a day     Norethindrone (Contraceptive) (ERRIN) 0.35 mg Tab take 1 Tab by mouth Once a day.      Allergies   Allergen Reactions    Estrogens  Other Adverse Reaction (Add comment)     Really high  BP    Penicillins Rash     History reviewed. No pertinent past medical history.    History reviewed. No pertinent surgical history.      History reviewed. No pertinent family history.      Social History     Social History    Marital status: Married     Spouse name: N/A    Number of children: N/A    Years of education: N/A     Social History Main Topics    Smoking status: Never Smoker    Smokeless tobacco: Never Used    Alcohol use No    Drug use: Not on file    Sexual activity: Not on file     Other Topics Concern    Not on file     Social History Narrative       Chief Complaint:  Back pain      HPI:  This patient is a 30 y.o. year old female who was referred for pain treatment for back pain. 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  She is having difficulty with the middle of the back over the past one year. She did see Dr. Melida QuitterMcCarthy and have injections - see media information.   She had three TMBB with over 75% reduction in pain.   He told her to come her to get the RFA.  She does have difficulty with raising her arms for longer periods of time at work.  She is standing for longer periods of time at work and this brings on spasms at times.   She has tried NSAIDs without any relief.  She  does not currently use a muscle relaxer but has in the past.   She needs to function for longer periods of time.   She wants to get the RFA done asap.     This patient has tried and failed conservative care in the form of modified activities, physical therapy and antiinflammatory medications and is currently on pain medications.  This patient is satisfied with the pain control currently.  This patient reports increased function.  This patient reports limited side effects including but not limited to constipation.   This patient is tolerating and overall satisfied with the current regimen.  This patient denies any changes in the medication, medical condition and allergies during the past interval.            ROS:  Review of Systems   Constitutional: Negative for fever. Negative for weight loss.   HENT: Negative.   Eyes: Negative.   Respiratory: Negative.   Cardiovascular: Negative for chest pain.   Gastrointestinal: Negative for abdominal pain and bowel incontinence.   Endocrine: Negative for heat intolerance.   Genitourinary: Negative. Negative for bladder incontinence, dysuria and pelvic pain.   Musculoskeletal: Positive for back pain.   Skin: Negative.   Neurological: Positive for tingling, weakness and numbness. Negative for headaches and paresthesias.   Psychiatric/Behavioral: Negative.     PHYSICAL EXAMINATION:BP 136/81   Pulse 62   Temp 36.7 C (98.1 F)   Resp 20   Ht 1.676 m (5\' 6" )   Wt 60.6 kg (133 lb 9.6 oz)   BMI 21.56 kg/m2    General: appears in good health, appears stated age and vital signs reviewed and discussed with patient  Eyes: Conjunctiva clear.  HENT:Head atraumatic and normocephalic  Neck: No thyromegaly or lymphadenopathy  Musculoskeletal:   Gait and Station:Normal  Musculoskeletal Tenderness: positive  Lungs: Clear to  auscultation bilaterally.   Cardiovascular: regular rate and rhythm, S1, S2 normal, no murmur, click, rub or gallop  Abdomen: Bowel sounds normal  Extremities: No  synovitis or joint effusions  Skin: Skin warm and dry  Neurologic:  Mental Status:  Alert, oriented x3. Cranial nerves 2-12 are grossly intact. Motor:  5/5 throughout.  Sensation: Intact to touch throughout.   Deep tendon reflexes: +2 throughout.     Muscular:  Cervical: Coordination finger/nose normal (pos)   heel/shin normal (pos)  Head: CNII-XII grossly intact (pos), Occipital Nerve WNL (pos)  Cervical ROM: Facets  CN2-12 GI  Normal ROM  Neg Spuring - yes which side  Neg Facet Loading  No Myofascial Tenderness  Normal Sensation to light touch   /4 Reflexes  /5 Strength  Normal Coordination- finger to nose  (neg) allodynia/ dysesthesia to light touch  (neg) pain on palpation spinal process  Thoracic - tenderness over the T10/11/12 bilateral  Lumbar:  Lumbar ROM normal  SLR (sit) (neg)  SLR (Supine) (neg)  Compression test positive  Fortin Finger Test positive  Trochan Bursa L  (neg) R  (neg)  Gluteal bursa L  (neg)R(neg) Lumbar Facet: (pos) (R/L)  Sacroiliac L(neg)     R (neg)   (neg) allodynia/ dysesthesia to light touch  (neg)pain on palpation spinal process  Gait: unremarkable / wide-based  /unsteady  / antalgic / ambulates without devices   Lymphatics: No lymphadenopathy  Psychiatric: Normal affect, behavior, memory, thought content, judgement, and speech.    Assessment:     ICD-10-CM    1. 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 Back pain, unspecified back location, unspecified back pain laterality, unspecified chronicity M54.9 PAIN CL FL FACET INJ CERVICAL THORACIC 2 LEVELS     FACET  INJ CERV THOR 2 LEVELS (AMB ONLY)   3. Chronic pain disorder G89.4 PAIN CL FL FACET INJ CERVICAL THORACIC 2 LEVELS     FACET  INJ CERV THOR 2 LEVELS (AMB ONLY)       Plan:  1.  PT.  2.  RFA Right and Left T10/11, T11/12   3.  Please schedule on April 26 and patient needs 2-3 hours travel time to get  here.  4.  Consider trigger point for spasms.        I spent time counseling the patient on narcotic safety and effects on the body.  The patient had an opportunity to ask questions and we discussed their care.   All questions were answered utilizing models, educational material and demonstration.      The patient was educated on the schedule injection including the risks/benefits and expected outcomes.  The patient had an opportunity to ask questions and we discussed their care.   All questions were answered utilizing models, educational material and demonstration.      Follow-up:  One month and/or after procedure.  The patient was instructed to return to our office if symptoms change, if worse - patient advised to seek care in an emergency room.  Patient verbalized and agreed with plan above.    Leim Fabry, NP, Ed.D, APRN, FNP-BC 12/22/2015, 16:41  Nurse Practitioner with [facility]  The patient was seen independently with the co-signing faculty present in clinic.

## 2016-01-13 ENCOUNTER — Encounter (INDEPENDENT_AMBULATORY_CARE_PROVIDER_SITE_OTHER): Payer: Self-pay | Admitting: Anesthesiology

## 2016-01-13 ENCOUNTER — Ambulatory Visit: Payer: No Typology Code available for payment source | Attending: Anesthesiology | Admitting: Anesthesiology

## 2016-01-13 DIAGNOSIS — M47814 Spondylosis without myelopathy or radiculopathy, thoracic region: Secondary | ICD-10-CM | POA: Insufficient documentation

## 2016-01-13 DIAGNOSIS — G894 Chronic pain syndrome: Secondary | ICD-10-CM

## 2016-01-13 DIAGNOSIS — M549 Dorsalgia, unspecified: Secondary | ICD-10-CM

## 2016-01-13 NOTE — Patient Instructions (Signed)
[facility]  [address]  [address]  [place] [id number]  Dept: [phone]  Dept Fax: [phone]  [phone]                                                 SPECIAL PROCEDURES                                     DISCHARGE FORM                                          [phone]      Please follow the instructions listed below for your procedures.  If you have questions concerning your procedure, you may call and leave a message.  Messages will be returned by the end of the next business day.  If you have an emergency, proceed to your local Emergency Department.      PROCEDURE: RFA     Do not drive a car or operate machinery until tomorrow.   Rest today and return to normal activities tomorrow.   If you are on restricted activities by your physician, please continue to follow these.  If you are not sure, contact your physician.   It is possible to experience mild numbness of the lower back and legs.  This is temporary.   If you have soreness at the injection site, the application of heat or ice may be helpful. Mild analgesics may also be used.   In case of severe headache; lie flat to decrease it.  Increase all fluids especially those with caffeine.  Mild analgesics are also appropriate.  If headache is not relieved by these measures, contact the Pain Clinic.   Steroid injections may cause temporary increase of blood sugar levels.    These instructions have been reviewed with the patient and appropriate questions have answers.  [name], RTR 01/13/2016 10:53

## 2016-01-13 NOTE — Procedures (Signed)
[facility]  [address]  [address]  [place] [id number]  [phone]        PATIENT NAME: Jennifer BowlValerie R Bevis  CHART VFIEPP:[id number]:[id number]  DATE OF BIRTH: May 21, 1986  DATE OF SERVICE:01/13/2016    SITE OF SERVICE  [facility].    PREOPERATIVE DIAGNOSIS:    Patient Active Problem List   Diagnosis    Hypertension    Back pain        POSTOPERATIVE DIAGNOSIS:  Same    NAME OF PROCEDURE: Bilateral  2 Level thoracic medial branch radiofrequency ablation.    SURGEON: Rodman Compichard Eilan Mcinerny, MD    ANESTHESIA: Local    ESTIMATED BLOOD LOSS:None    COMPLICATIONS:  None    DESCRIPTION OF PROCEDURE:  After informed consent was obtained, the patient was taken to the procedure room and placed in the prone position.  The lateral pillars at T10 and   T-11 and T-12 on the Bilateral were identified with fluoroscopy.  The neck had been prepped and draped in a sterile fashion.  Next, 25-gauge,  treatment needles were placed down to the lateral pillars at those levels with fluoroscopic visualization.  Sensory and motor stimulation were performed at each level and found to show reproduction of the patients pain with no stimulation of motor nerves in the neck or arm.  The needles were then injected with a mixture of steroids and local anesthetic consisting of Dexamethasone 4mg  (1ml) and 0.5% Sensorcaine with epinephrine. 0.585ml injected at level, each lesion made at each level at 80% centigrade for 90 seconds each.  The patient tolerated the procedure well.   Follow up as needed.      Rodman Compichard Diedra Sinor, MD 01/13/2016, 11:06  Assistant Professor  Department of Neurosurgery  Division of Pain Medicine

## 2016-02-06 ENCOUNTER — Encounter (INDEPENDENT_AMBULATORY_CARE_PROVIDER_SITE_OTHER): Payer: No Typology Code available for payment source | Admitting: Family

## 2016-03-08 ENCOUNTER — Ambulatory Visit: Payer: No Typology Code available for payment source | Attending: Anesthesiology | Admitting: Anesthesiology

## 2016-03-08 VITALS — BP 117/83 | HR 69 | Temp 97.2°F | Resp 20 | Ht 64.84 in | Wt 135.1 lb

## 2016-03-08 DIAGNOSIS — Z791 Long term (current) use of non-steroidal anti-inflammatories (NSAID): Secondary | ICD-10-CM | POA: Insufficient documentation

## 2016-03-08 DIAGNOSIS — M549 Dorsalgia, unspecified: Secondary | ICD-10-CM | POA: Insufficient documentation

## 2016-03-08 DIAGNOSIS — I10 Essential (primary) hypertension: Secondary | ICD-10-CM | POA: Insufficient documentation

## 2016-03-08 DIAGNOSIS — Z79891 Long term (current) use of opiate analgesic: Secondary | ICD-10-CM | POA: Insufficient documentation

## 2016-03-08 DIAGNOSIS — Z79899 Other long term (current) drug therapy: Secondary | ICD-10-CM | POA: Insufficient documentation

## 2016-03-08 NOTE — Progress Notes (Signed)
Defense and Veterans Pain Rating Scale    On a scale of 0-10, what is your overall pain Rating:6        On a scale of 0-10, during the past 24 hours, pain has interfered with you usual activity:   5    On a scale of 0-10, during the past 24 hours, pain has interfered with your sleep:  3    On a scale of 0-10, during the past 24 hours, pain has affected your mood:   2    On a scale of 0-10, during the past 24 hours, pain has contributed to your stress:   4  Date:03/08/2016    Chief Complaint:  Chief Complaint   Patient presents with    Back Pain     Severity:severe   Timing:daily  Palliative:nothing  Provocative:activity  Description:sharp  Duration:years    Last Procedure:rfa thoracic  Result:no help      Medication:  Current Outpatient Prescriptions   Medication Sig    cyclobenzaprine (FLEXERIL) 10 mg Tab take 1 Tab by mouth every night.  (Patient not taking: Reported on 03/08/2016)    DULoxetine (CYMBALTA) 30 mg Oral Capsule, Delayed Release(E.C.) Take 30 mg by mouth Once a day    FLUoxetine (PROZAC) 40 mg Oral Capsule Take 40 mg by mouth Once a day    hydroxychloroquine (PLAQUENIL) 200 mg Oral Tablet Take 200 mg by mouth Once a day    levothyroxine (SYNTHROID) 50 mcg Tab Take 112 mcg by mouth Once a day     meloxicam (MOBIC) 15 mg Oral Tablet Take 15 mg by mouth Once a day    methocarbamol (ROBAXIN) 500 mg Oral Tablet Take 500 mg by mouth Four times a day    Norethindrone (Contraceptive) (ERRIN) 0.35 mg Tab take 1 Tab by mouth Once a day.     traMADol (ULTRAM) 50 mg Oral Tablet Take 50 mg by mouth Every 6 hours as needed               Vitals:    03/08/16 1348   BP: 117/83   Pulse: 69   Resp: 20   Temp: 36.2 C (97.2 F)   TempSrc: Oral   SpO2: 100%   Weight: 61.3 kg (135 lb 2.3 oz)   Height: 1.647 m (5' 4.84")     There are no exam notes on file for this visit.      PFMSHx: I have reviewed and updated as appropriate the past medical, family and social history.    No past medical history on file.      No  past surgical history on file.      No family history on file.      Social History     Social History    Marital status: Married     Spouse name: N/A    Number of children: N/A    Years of education: N/A     Social History Main Topics    Smoking status: Never Smoker    Smokeless tobacco: Never Used    Alcohol use No    Drug use: Not on file    Sexual activity: Not on file     Other Topics Concern    Not on file     Social History Narrative     Expanded Substance History     Additional history           XLK:[id number]:Constitutional: negative, Eyes: negative, Ears, nose, mouth, throat, and face: negative,  Respiratory: negative, Gastrointestinal: negative, Musculoskeletal:negative except for HPI, Neurological: negative except for HPI and Behavioral/Psych: negative except for HPI    Exam:    Constitutional: BP 117/83   Pulse 69   Temp 36.2 C (97.2 F) (Oral)    Resp 20   Ht 1.647 m (5' 4.84")   Wt 61.3 kg (135 lb 2.3 oz)   SpO2 100%   BMI 22.6 kg/m2 Weight: 61.3 kg (135 lb 2.3 oz)   , General appearance unremarkable    Eyes: Conjunctiva and lids: normal    Head: NC/AT    Neck: Normal external inspection    Respiratory: normal effort    Cardiovascular: no JVD or peripheral cardiac edema    Abdomen: non tender    Neurological: DTR'S symmetrical                        Sensory intact                        Motor intact                          Psychiatric: Judgement normal                      Insight good                      Oriented times 3                      Memory intact                      Mood stable                      Affect normal    MSK:   Lumbar Cervical              Gait:            ROM:              SLR: equal           Spasm:              Strength: equal           Strength:               GON/LON:         Facet Pain: Facet Pain:              Facets Loading:           Facet Loading:              Spasm:           Spasm:               Pain to Palpation:           Pain to Palpation:              Axial back  pain:           Axial neck pain: pos              Trigger Points:           Trigger Points:pos    Radiculopathy: Radiculopathy:         SI Joint Med Follow-up  SI pain:            Pre med pain:              Provocation Test (1-3):           Post med pain:                        Gaenslen's:           Side effects:                        FABER:           ADL's:                                  Distraction:            Other:                        Compression:                Spondyloarthritis:               Fusion L5-S1:                                                             Myofascial:                                      History of onset of the painful condition, and its presumed cause:                                                              Distribution pattern of pain consistent with the referral pattern of the trigger                                             points;                                                              Restriction of range of motion with increased sensitivity to stretch;                                                             Muscular deconditioning in the affected area;  Focal tenderness of a trigger point;                                                     Palpable taut band of muscle in which trigger point is located;                                Assessment:  Patient Active Problem List   Diagnosis    Hypertension    Back pain           Plan:  Procedure:   Orders Placed This Encounter    TPI 3+ MUS W/O BOTOX, W/US (AMB ONLY)    US GUIDED TRIGGER POINT INJ WO BOTOX         [place]CSMP Checked:    Imaging:    Testing:    Referral:    New Prescription / Refill / Sample:    Follow up:                                                              If the patient is taking chronic opioids they were told not to put their medication in a medicine cabinet, to keep them hidden or preferably locked up, not  to discuss their use in public and not to share or use others medication.        Patient was referred for pain treatment. They have tried and failed conservative care in the form of modified activities, physical therapy and antiinflammatory medications or pain medications.                               Rodman Compichard Shawonda Kerce, MD 03/08/2016, 14:02

## 2016-04-14 ENCOUNTER — Encounter (INDEPENDENT_AMBULATORY_CARE_PROVIDER_SITE_OTHER): Payer: Self-pay | Admitting: Anesthesiology

## 2016-04-15 ENCOUNTER — Other Ambulatory Visit (INDEPENDENT_AMBULATORY_CARE_PROVIDER_SITE_OTHER): Payer: Self-pay | Admitting: Anesthesiology

## 2016-04-15 DIAGNOSIS — M549 Dorsalgia, unspecified: Secondary | ICD-10-CM

## 2016-04-19 ENCOUNTER — Encounter (INDEPENDENT_AMBULATORY_CARE_PROVIDER_SITE_OTHER): Payer: Self-pay | Admitting: Anesthesiology

## 2016-04-27 ENCOUNTER — Encounter (INDEPENDENT_AMBULATORY_CARE_PROVIDER_SITE_OTHER): Payer: Self-pay

## 2016-04-27 ENCOUNTER — Ambulatory Visit (INDEPENDENT_AMBULATORY_CARE_PROVIDER_SITE_OTHER): Payer: No Typology Code available for payment source | Admitting: Anesthesiology

## 2016-06-16 ENCOUNTER — Ambulatory Visit: Payer: No Typology Code available for payment source | Attending: Anesthesiology | Admitting: Anesthesiology

## 2016-06-16 DIAGNOSIS — M549 Dorsalgia, unspecified: Secondary | ICD-10-CM

## 2016-06-16 DIAGNOSIS — M546 Pain in thoracic spine: Secondary | ICD-10-CM | POA: Insufficient documentation

## 2016-06-16 NOTE — Patient Instructions (Signed)
[facility]  [address]  [place] [id number]  Dept: [phone]  Dept Fax: [phone]  [phone]                                                 SPECIAL PROCEDURES                                     DISCHARGE FORM                                          [phone]      Please follow the instructions listed below for your procedures.  If you have questions concerning your procedure, you may call and leave a message.  Messages will be returned by the end of the next business day.  If you have an emergency, proceed to your local Emergency Department.      PROCEDURE: TRIGGER POINT INJECTIONS     Do not drive a car or operate machinery until tomorrow.   Rest today and return to normal activities tomorrow.   If you are on restricted activities by your physician, please continue to follow these.  If you are not sure, contact your physician.   If you have soreness at the injection site, the application of heat or ice may be helpful. Mild analgesics may also be used.   Steroid injections may cause temporary increase of blood sugar levels.    These instructions have been reviewed with the patient and appropriate questions have answers.  Jerlyn LySara Freja Faro, LPN 02/72/536610/07/2016 44:[id number]:20

## 2016-06-16 NOTE — Procedures (Signed)
[facility]  [address]  [place] [id number]  [phone]      PATIENT NAME: Jennifer Lindsey  CHART BJYNWG:[id number]:[id number]  DATE OF BIRTH: 30-Aug-1986  DATE OF SERVICE:06/16/2016    SITE OF SERVICE  [facility]    PREOPERATIVE DIAGNOSIS:   Patient Active Problem List   Diagnosis    Hypertension    Back pain            POSTOPERATIVE DIAGNOSIS: Same      NAME OF PROCEDURE: Trigger Point Injection with Ultrasound    SURGEON: Rodman Compichard Yury Schaus, MD      ANESTHESIA: None    ESTIMATED BLOOD LOSS: None    COMPLICATIONS:  were no complications    DESCRIPTION OF PROCEDURE:       After informed consent was obtained, patient was placed in a seated position. Areas of most tenderness were palpated and marked.  The skin was prepped and draped in the usual sterile fashion using alcohol.  At each of the previously marked locations,  0.25% Bupivacaine was injected after negative aspiration in a fanning manner in various planes. There was either or both a local taut response to snapping palpation or needle   insertion; and reproduction of referred pain pattern upon stimulation   of the trigger point. There were no complications or paresthesias.Patient tolerated the procedure well and all needles were removed intact.    Ultrasound confirmation of muscles:thoracic paraspinal     Patient was observed in recovery and discharged home with written instructions under supervision in stable condition.    Rodman Compichard Romin Divita, MD 06/16/2016, 13:00  AssociateProfessor  Department of Anesthesiology and Psychiatry  Director Outpatient Pain Services  See  note for details. I saw and evaluated the patient and agree with the resident's findings and plan as written except as noted and I was present and supervised all procedures performed.    Rodman Compichard Any Mcneice MD  06/16/2016, 13:00

## 2016-06-16 NOTE — Progress Notes (Signed)
Pain and Function:     Defense and Veterans Pain Rating Scale     On a scale of 0-10, during the past 24 hours, pain has interfered with you usual activity: 6     On a scale of 0-10, during the past 24 hours, pain has interfered with your sleep: 5    On a scale of 0-10, during the past 24 hours, pain has affected your mood: 3     On a scale of 0-10, during the past 24 hours, pain has contributed to your stress: 6     On a scale of 0-10, what is your overall pain Rating: 5

## 2016-06-21 ENCOUNTER — Encounter (INDEPENDENT_AMBULATORY_CARE_PROVIDER_SITE_OTHER): Payer: Self-pay | Admitting: Anesthesiology

## 2016-07-03 ENCOUNTER — Other Ambulatory Visit: Payer: Self-pay

## 2016-07-05 ENCOUNTER — Encounter (INDEPENDENT_AMBULATORY_CARE_PROVIDER_SITE_OTHER): Payer: No Typology Code available for payment source | Admitting: NURSE PRACTITIONER

## 2016-07-09 ENCOUNTER — Encounter (INDEPENDENT_AMBULATORY_CARE_PROVIDER_SITE_OTHER): Payer: Self-pay | Admitting: Anesthesiology

## 2017-07-09 ENCOUNTER — Other Ambulatory Visit: Payer: Self-pay

## 2017-11-22 ENCOUNTER — Ambulatory Visit (HOSPITAL_COMMUNITY): Payer: Self-pay

## 2018-01-09 ENCOUNTER — Encounter (INDEPENDENT_AMBULATORY_CARE_PROVIDER_SITE_OTHER): Payer: No Typology Code available for payment source | Admitting: Gerontology

## 2018-06-13 ENCOUNTER — Encounter (INDEPENDENT_AMBULATORY_CARE_PROVIDER_SITE_OTHER): Payer: No Typology Code available for payment source | Admitting: Family

## 2019-05-03 NOTE — OR Surgeon (Signed)
[facility]  [address] [place], [place] [address]  [phone] or [phone]    C O N F I D E N T I A L   I N F O R M A T I O N  -------------------------------------------------------------------------------------------------------------------  STANDARD [facility] DOCUMENT  OPERATIVE REPORT Otolaryngology Head and Neck Surgery  Name:  Jennifer Lindsey  [facility] #:  [id number]  Date:  05/03/2019  Date of Birth:  09-14-1985    Pre Operative Diagnoses: Patulous Eustachian Tube   Post Operative Diagnoses:  Same    Incision/Procedure Start Time: 3:08 PM  Incision Close/Procedure End Time: 4:08 PM     Surgical Procedure:   1.Bilateral Injection of Eustachian tubes with Restalyne   2.Bilateral inferior turbinate outfracture    Primary Surgeon:  Jovita Gamma, MD  Resident Surgeon: Lysle Dingwall, MD  Anesthesia:  General anesthesia.    Findings: Bilateral eustachian tube orifices injected with 0.77mL of Restalyne.     Procedure Indications: Jennifer Lindsey is an 33 year old female with a history of patulous eustachian tube dysfunction who experienced autophony to voice and breathing. She has tried Patulend drops and vent tubes without improvement. Thus she presented to the OR for the above procedure.    Operative Note:   After bringing the patient back to the operating room a huddle was performed confirming the correct patient, procedure, allergies, and plan; all present, including the patient agreed. Patient was turned over the anesthesia care for induction of anesthesia.    Afrin was inserted into the nares bilaterally. 10mL of Lidocaine w/ epi were injected bilaterally. The inferior turbinates were outfractured bilaterally. The eustachian tube orrifice was visualized on the left side and Restalyne was injected into the eustachian tube cleft as superiorly as possible. In total 0.47mL was injected into the left eustachian tube orifice. The eustachian tube orrifice was visualized on the right side and Restalyne  was injected into the eustachian tube cleft as superiorly as possible. In total 0.23mL was injected into the right eustachian tube orifice.     The patient was transferred to anesthesia's care for wake up.      Jovita Gamma, MD was present and scrubbed for the entirety of the case and performed the critical components.    Specimens: * No specimens in log *    Implants: * No implants in log *    Drains: None    Complications: None    Blood Loss: 5mL      Lysle Dingwall, MD dictating an operative report for  Jovita Gamma, MD

## 2020-03-14 ENCOUNTER — Ambulatory Visit (INDEPENDENT_AMBULATORY_CARE_PROVIDER_SITE_OTHER): Payer: Self-pay | Admitting: Otolaryngology

## 2020-03-14 NOTE — Telephone Encounter (Signed)
Regarding: PATULOUS EUSTACHIAN TUBES   ----- Message from Eudelia Bunch sent at 03/14/2020 10:11 AM EDT -----  Dr. Conchita Paris office needs to know of any ENT Providers will see patient for Patulous Eustachian Tubes //   Please return call to Delano Metz @ office to advise at [facility]: [phone] //   Thank You.Marland Kitchen

## 2020-03-14 NOTE — Telephone Encounter (Signed)
Returned call to [facility] pt with Dr. Juline Patch on 04/02/20 at [facility] at 1:45 pm NPV. Toma Copier will let pt know, nothing further.

## 2020-04-02 ENCOUNTER — Ambulatory Visit (INDEPENDENT_AMBULATORY_CARE_PROVIDER_SITE_OTHER): Payer: Self-pay | Admitting: Otolaryngology

## 2020-09-26 ENCOUNTER — Other Ambulatory Visit (HOSPITAL_COMMUNITY): Payer: Self-pay

## 2020-09-26 LAB — EXTERNAL COVID-19 MOLECULAR RESULT: External 2019-n-CoV/SARS-CoV-2: POSITIVE — AB

## 2020-10-07 ENCOUNTER — Encounter (INDEPENDENT_AMBULATORY_CARE_PROVIDER_SITE_OTHER): Payer: Self-pay | Admitting: Hematology & Oncology

## 2021-10-08 ENCOUNTER — Other Ambulatory Visit: Payer: Self-pay

## 2021-10-08 MED ORDER — TRETINOIN 0.05 % TOPICAL CREAM
TOPICAL_CREAM | CUTANEOUS | 4 refills | Status: AC
Start: 2021-09-02 — End: ?
  Filled 2021-10-08: qty 40, 40d supply, fill #0
  Filled 2022-05-11: qty 40, 90d supply, fill #0

## 2021-10-08 MED ORDER — ONDANSETRON 4 MG DISINTEGRATING TABLET
ORAL_TABLET | ORAL | 0 refills | Status: DC
Start: 2021-08-20 — End: 2023-12-26
  Filled 2021-10-08: qty 21, 5d supply, fill #0
  Filled 2021-10-10: qty 21, 6d supply, fill #0

## 2021-10-08 MED ORDER — CLOTRIMAZOLE 1 % TOPICAL CREAM
TOPICAL_CREAM | CUTANEOUS | 0 refills | Status: DC
Start: 2021-07-22 — End: 2022-03-25

## 2021-10-08 MED ORDER — NORETHINDRONE (CONTRACEPTIVE) 0.35 MG TABLET
ORAL_TABLET | ORAL | 2 refills | Status: DC
Start: 2021-09-11 — End: 2023-11-25
  Filled 2021-10-22 – 2021-10-30 (×2): qty 84, 84d supply, fill #0
  Filled 2022-01-13: qty 84, 84d supply, fill #1

## 2021-10-08 MED ORDER — LIDOCAINE-PRILOCAINE 2.5 %-2.5 % TOPICAL CREAM
TOPICAL_CREAM | CUTANEOUS | 0 refills | Status: DC
Start: 2021-07-22 — End: 2023-11-25
  Filled 2022-05-11: qty 90, 3d supply, fill #0

## 2021-10-08 MED ORDER — AMLODIPINE 10 MG TABLET
ORAL_TABLET | ORAL | 0 refills | Status: AC
Start: 2021-07-22 — End: ?

## 2021-10-08 MED ORDER — LEVOTHYROXINE 112 MCG TABLET
ORAL_TABLET | ORAL | 0 refills | Status: DC
Start: 2021-07-22 — End: 2022-01-28
  Filled 2021-11-06: qty 90, 90d supply, fill #0

## 2021-10-08 MED ORDER — CITALOPRAM 40 MG TABLET
ORAL_TABLET | ORAL | 2 refills | Status: DC
Start: 2021-10-01 — End: 2022-01-28
  Filled 2021-11-06: qty 30, 30d supply, fill #0
  Filled 2021-12-15: qty 30, 30d supply, fill #1

## 2021-10-08 MED ORDER — BUSPIRONE 10 MG TABLET
ORAL_TABLET | ORAL | 0 refills | Status: DC
Start: 2021-07-22 — End: 2022-01-28
  Filled 2021-11-06: qty 90, 90d supply, fill #0

## 2021-10-08 MED ORDER — CYANOCOBALAMIN (VIT B-12) 1,000 MCG/ML INJECTION SOLUTION
INTRAMUSCULAR | 2 refills | Status: AC
Start: 2021-07-22 — End: ?
  Filled 2021-10-10: qty 3, 90d supply, fill #0
  Filled 2021-12-29: qty 3, 90d supply, fill #1
  Filled 2022-04-11: qty 3, 90d supply, fill #2

## 2021-10-08 MED ORDER — LIDOCAINE-PRILOCAINE 2.5 %-2.5 % TOPICAL CREAM
TOPICAL_CREAM | CUTANEOUS | 0 refills | Status: DC
Start: 2021-04-30 — End: 2022-03-25

## 2021-10-09 ENCOUNTER — Other Ambulatory Visit: Payer: Self-pay

## 2021-10-10 ENCOUNTER — Other Ambulatory Visit: Payer: Self-pay

## 2021-10-12 ENCOUNTER — Other Ambulatory Visit: Payer: Self-pay

## 2021-10-22 ENCOUNTER — Other Ambulatory Visit: Payer: Self-pay

## 2021-10-30 ENCOUNTER — Other Ambulatory Visit: Payer: Self-pay

## 2021-11-06 ENCOUNTER — Other Ambulatory Visit: Payer: Self-pay

## 2021-11-09 ENCOUNTER — Other Ambulatory Visit: Payer: Self-pay

## 2021-12-15 ENCOUNTER — Other Ambulatory Visit: Payer: Self-pay

## 2021-12-16 ENCOUNTER — Other Ambulatory Visit: Payer: Self-pay

## 2021-12-17 ENCOUNTER — Other Ambulatory Visit: Payer: Self-pay

## 2021-12-21 ENCOUNTER — Other Ambulatory Visit: Payer: Self-pay

## 2021-12-21 ENCOUNTER — Other Ambulatory Visit (HOSPITAL_COMMUNITY): Payer: Self-pay

## 2021-12-21 ENCOUNTER — Inpatient Hospital Stay
Admission: RE | Admit: 2021-12-21 | Discharge: 2021-12-21 | Disposition: A | Discharge status: Home / Self Care | Payer: No Typology Code available for payment source | Source: Ambulatory Visit

## 2021-12-21 DIAGNOSIS — R52 Pain, unspecified: Secondary | ICD-10-CM

## 2021-12-22 ENCOUNTER — Other Ambulatory Visit: Payer: Self-pay

## 2021-12-22 DIAGNOSIS — M25532 Pain in left wrist: Secondary | ICD-10-CM

## 2021-12-27 ENCOUNTER — Other Ambulatory Visit: Payer: Self-pay

## 2021-12-30 ENCOUNTER — Other Ambulatory Visit: Payer: Self-pay

## 2022-01-01 ENCOUNTER — Other Ambulatory Visit: Payer: Self-pay

## 2022-01-06 ENCOUNTER — Other Ambulatory Visit: Payer: Self-pay

## 2022-01-08 ENCOUNTER — Other Ambulatory Visit: Payer: Self-pay

## 2022-01-11 ENCOUNTER — Other Ambulatory Visit: Payer: Self-pay

## 2022-01-13 ENCOUNTER — Other Ambulatory Visit: Payer: Self-pay

## 2022-01-14 ENCOUNTER — Other Ambulatory Visit: Payer: Self-pay

## 2022-01-16 ENCOUNTER — Other Ambulatory Visit: Payer: Self-pay

## 2022-01-18 ENCOUNTER — Other Ambulatory Visit: Payer: Self-pay

## 2022-01-23 ENCOUNTER — Other Ambulatory Visit: Payer: Self-pay

## 2022-01-28 ENCOUNTER — Other Ambulatory Visit: Payer: Self-pay

## 2022-01-28 MED ORDER — AMLODIPINE 10 MG TABLET
ORAL_TABLET | ORAL | 0 refills | Status: DC
Start: 2022-01-13 — End: 2023-05-11
  Filled 2022-01-28 – 2022-01-29 (×2): qty 90, 90d supply, fill #0

## 2022-01-28 MED ORDER — LEVOTHYROXINE 112 MCG TABLET
ORAL_TABLET | ORAL | 0 refills | Status: DC
Start: 2022-01-13 — End: 2022-05-13
  Filled 2022-01-28: qty 90, 90d supply, fill #0

## 2022-01-28 MED ORDER — CITALOPRAM 40 MG TABLET
ORAL_TABLET | ORAL | 0 refills | Status: AC
Start: 2022-01-13 — End: ?
  Filled 2022-01-28: qty 90, 90d supply, fill #0

## 2022-01-28 MED ORDER — BUSPIRONE 10 MG TABLET
ORAL_TABLET | ORAL | 0 refills | Status: AC
Start: 2022-01-13 — End: ?
  Filled 2022-01-28: qty 90, 90d supply, fill #0

## 2022-01-29 ENCOUNTER — Other Ambulatory Visit: Payer: Self-pay

## 2022-02-02 ENCOUNTER — Other Ambulatory Visit: Payer: Self-pay

## 2022-02-03 ENCOUNTER — Other Ambulatory Visit: Payer: Self-pay

## 2022-02-05 ENCOUNTER — Other Ambulatory Visit: Payer: Self-pay
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
## 2022-03-25 ENCOUNTER — Encounter (RURAL_HEALTH_CENTER): Payer: Self-pay | Admitting: INTERNAL MEDICINE-ENDOCRINOLOGY-DIABETES AND METABOLISM

## 2022-03-25 ENCOUNTER — Other Ambulatory Visit: Payer: Self-pay

## 2022-03-25 ENCOUNTER — Ambulatory Visit
Payer: No Typology Code available for payment source | Attending: FAMILY PRACTICE | Admitting: INTERNAL MEDICINE-ENDOCRINOLOGY-DIABETES AND METABOLISM

## 2022-03-25 VITALS — BP 135/88 | HR 64 | Temp 98.1°F | Resp 17 | Ht 64.84 in | Wt 134.0 lb

## 2022-03-25 DIAGNOSIS — R5383 Other fatigue: Secondary | ICD-10-CM | POA: Insufficient documentation

## 2022-03-25 DIAGNOSIS — R799 Abnormal finding of blood chemistry, unspecified: Secondary | ICD-10-CM | POA: Insufficient documentation

## 2022-03-25 DIAGNOSIS — Z79890 Hormone replacement therapy (postmenopausal): Secondary | ICD-10-CM | POA: Insufficient documentation

## 2022-03-25 DIAGNOSIS — E039 Hypothyroidism, unspecified: Secondary | ICD-10-CM | POA: Insufficient documentation

## 2022-03-25 MED ORDER — LIOTHYRONINE 5 MCG TABLET
5.0000 ug | ORAL_TABLET | Freq: Every day | ORAL | 0 refills | Status: DC
Start: 2022-03-25 — End: 2022-05-13
  Filled 2022-03-25: qty 60, 60d supply, fill #0

## 2022-03-25 NOTE — Progress Notes (Signed)
Family Medicine, Ambulatory [facility]  [address]  [place] [address]  [phone]    Date: 03/25/2022  Patient Name:  Jennifer Lindsey   Age: 36 y.o.  Attending: Sheryle Hail, MD - Endocrinology, Diabetes and Metabolism  PCP: Earlean Shawl, DO    I have personally reviewed allergies, chief complaint, medications, past medical history, surgical history, and family history.  I have reviewed referral notes, PCP progress notes and labs and images relevant to this visit.      Assessment/Recommendations:     1. Hypothyroidism  - TPO positive  - continue levothyroxine PO daily (has been taking at night so likely not getting full dose, advised we very likely will have to lower levothyroxine dose once taking correctly)  - start liothyronine daily  - reassess labs in 6 weeks    2. Fatigue  - labs as below    Orders Placed This Encounter   . THYROID STIMULATING HORMONE (SENSITIVE TSH)   . IRON TRANSFERRIN AND TIBC   . VITAMIN D 25 TOTAL   . COMPREHENSIVE METABOLIC PANEL, NON-FASTING   . CBC/DIFF   . liothyronine (CYTOMEL) 5 mcg Oral Tablet     Return in about 1 year (around 03/26/2023) for In Person Visit.    Chief Complaint:    Chief Complaint   Patient presents with   . New Patient     Hypothyroidism     History of Present Illness:   36 year old female presents for evaluation and management of hyperthyroidism.  Diagnosed when she was 20 or 21.  Is on levothyroxine 112 mcg.  She takes this at night.  Discussed the consistent and correct way to take medication.  Discussed some patients with long-term hypothyroidism having improvement in fatigue with the addition of T3.  Discussed the risks and benefits at length.  She wants to do a trial of T3 therapy in addition to her levothyroxine.  Also has fatigue.  Has lupus.    Allergies:  Allergies   Allergen Reactions   . Estrogens  Other Adverse Reaction (Add comment)     Really high BP   . Penicillins Rash     Medications:     Outpatient Medications Marked as Taking for the 03/25/22 encounter (Office Visit) with Marcelle Overlie, MD   Medication Sig   . amLODIPine (NORVASC) 10 mg Oral Tablet Take one tablet by mouth daily   . busPIRone (BUSPAR) 10 mg Oral Tablet Take one tablet by mouth daily   . citalopram (CELEXA) 40 mg Oral Tablet Take one tablet by mouth daily   . cyanocobalamin (VITAMIN B12) 1,000 mcg/mL Injection Solution Inject 4mL IM once a month   . levothyroxine (SYNTHROID) 112 mcg Oral Tablet Take one tablet by mouth daily   . liothyronine (CYTOMEL) 5 mcg Oral Tablet Take 1 Tablet (5 mcg total) by mouth Once a day   . Norethindrone, Contraceptive, (Heather) 0.35 mg Oral tablet Take 1 tablet  by mouth once daily   . traMADoL (ULTRAM) 50 mg Oral Tablet Take 1 Tablet (50 mg total) by mouth Every 6 hours as needed     Past Medical History:  History reviewed. No pertinent past medical history.  Lupus  Hypothyroidism  Endometriosis  B12 deficiency    No relevant surgical history    Social History:    Social History     Socioeconomic History   . Marital status: Married   Tobacco Use   . Smoking status:  Never   . Smokeless tobacco: Never   Substance and Sexual Activity   . Alcohol use: No   . Drug use: Never     Family History:  Family Medical History:    None       Review of Systems: see hpi  Constitutional: negative for fevers, chills, weight changes  HEENT: negative for acute vision changes or URI symptoms  Neck:  negative dysphonia, dysphagia or neck pain  Respiratory: negative for cough, sputum, dyspnea on exertion  Cardiovascular: negative for chest discomfort, palpitations  Gastrointestinal: negative for nausea, pain, stool changes  Genitourinary: negative for frequency, dysuria, nocturia, hesitancy  Hematologic/lymphatic: negative for changes in bleeding or bruisability  Musculoskeletal:negative for new myalgisa, arthralgia, and muscle weakness  Neurological: negative for new headaches, vision changes, numbness, weakness,  tingling  Psychiatric: mood stable, cognition stable  Endocrine:  As per HPI.    Objective:     Vital Signs:  BP 135/88   Pulse 64   Temp 36.7 C (98.1 F)   Resp 17   Ht 1.647 m (5' 4.84")   Wt 60.8 kg (134 lb)   LMP 03/18/2022 (Approximate)   SpO2 100%   BMI 22.41 kg/m      Body mass index is 22.41 kg/m.    Examination:  General:  Age-appropriate, no acute distress  HEENT:  Eyes clear.  Extraocular movements intact. Moist mucous membranes.  Neck:  No goiter. No JVD  Cardiac:  No bounding pulses or heaves, thrills.   Pulmonary:  No audible wheezing.  Pulmonary effort normal.  Gastrointestinal:  Nondistended, no organomegaly.   Musculoskeletal:  No deformity or lesions.  Skin:  Warm and of normal moisture.  Neurologic:  Alert and oriented.  Nonfocal.  No tremor.  Gait normal.  Psychiatric:  Affect normal.    Diagnostic Review:      No results found for: HA1C  Lab Results   Component Value Date    TSH 5.729 05/02/2008     TSH (uIU/mL)   Date Value   05/02/2008 5.729     Sheryle Hail, MD  Endocrinology, Diabetes and Metabolism    This note may have been partially generated using MModal Fluency Direct system, and there may be some incorrect words, spellings, and punctuation that were not noted in checking the note before saving.

## 2022-04-08 ENCOUNTER — Encounter (RURAL_HEALTH_CENTER): Payer: Self-pay | Admitting: INTERNAL MEDICINE-ENDOCRINOLOGY-DIABETES AND METABOLISM

## 2022-04-12 ENCOUNTER — Other Ambulatory Visit: Payer: Self-pay

## 2022-04-30 ENCOUNTER — Other Ambulatory Visit (HOSPITAL_COMMUNITY): Payer: Self-pay

## 2022-04-30 DIAGNOSIS — Z1239 Encounter for other screening for malignant neoplasm of breast: Secondary | ICD-10-CM

## 2022-05-03 ENCOUNTER — Encounter (HOSPITAL_COMMUNITY): Payer: Self-pay

## 2022-05-11 ENCOUNTER — Other Ambulatory Visit: Payer: Self-pay

## 2022-05-11 LAB — HM EGFR: GFR: 115 mL/min/{1.73_m2}

## 2022-05-13 ENCOUNTER — Ambulatory Visit
Payer: No Typology Code available for payment source | Attending: INTERNAL MEDICINE-ENDOCRINOLOGY-DIABETES AND METABOLISM | Admitting: INTERNAL MEDICINE-ENDOCRINOLOGY-DIABETES AND METABOLISM

## 2022-05-13 ENCOUNTER — Other Ambulatory Visit: Payer: Self-pay

## 2022-05-13 ENCOUNTER — Other Ambulatory Visit (RURAL_HEALTH_CENTER): Payer: Self-pay

## 2022-05-13 DIAGNOSIS — R799 Abnormal finding of blood chemistry, unspecified: Secondary | ICD-10-CM

## 2022-05-13 DIAGNOSIS — E039 Hypothyroidism, unspecified: Secondary | ICD-10-CM

## 2022-05-13 MED ORDER — LIOTHYRONINE 5 MCG TABLET
5.0000 ug | ORAL_TABLET | Freq: Every day | ORAL | 0 refills | Status: DC
Start: 2022-05-13 — End: 2022-05-19

## 2022-05-13 MED ORDER — LEVOTHYROXINE 88 MCG TABLET
88.0000 ug | ORAL_TABLET | Freq: Every morning | ORAL | 0 refills | Status: DC
Start: 2022-05-13 — End: 2022-06-25

## 2022-05-13 NOTE — Progress Notes (Signed)
FAMILY MEDICINE, AMBULATORY [facility]  [address]  [place] [address]    Telephone Visit    Name:  Jennifer Lindsey MRN: [id number]   Date:  05/13/2022 Age:   36 y.o.     The patient/family initiated a request for telephone service.  Verbal consent for this service was obtained from the patient/family.    Last office visit in this department: 03/25/2022      Reason for call: Hypothyroidism    Call notes:    TSH 0.4    On levo 112 and liothyronine daily    No difference in fatigue, in fact states she may feel more tired?     Decrease levothyroxine to daily, increase liothyronine to daily    Repeat labs in 6 weeks    If no improvement, she wants to try armour thyroid      ICD-10-CM    1. Abnormal blood chemistry  R79.9       2. Hypothyroidism, unspecified type  E03.9         Total provider time spent with the patient on the phone: 8 minutes.    Sheryle Hail, MD  Endocrinology, Diabetes and Metabolism

## 2022-05-19 ENCOUNTER — Encounter (RURAL_HEALTH_CENTER): Payer: Self-pay | Admitting: INTERNAL MEDICINE-ENDOCRINOLOGY-DIABETES AND METABOLISM

## 2022-05-19 ENCOUNTER — Other Ambulatory Visit (RURAL_HEALTH_CENTER): Payer: Self-pay | Admitting: INTERNAL MEDICINE-ENDOCRINOLOGY-DIABETES AND METABOLISM

## 2022-05-19 MED ORDER — LIOTHYRONINE 5 MCG TABLET
10.0000 ug | ORAL_TABLET | Freq: Every day | ORAL | 0 refills | Status: DC
Start: 2022-05-19 — End: 2022-06-25

## 2022-05-27 ENCOUNTER — Ambulatory Visit (RURAL_HEALTH_CENTER): Payer: 59 | Admitting: INTERNAL MEDICINE-ENDOCRINOLOGY-DIABETES AND METABOLISM

## 2022-06-15 ENCOUNTER — Other Ambulatory Visit: Payer: Self-pay

## 2022-06-22 ENCOUNTER — Encounter (RURAL_HEALTH_CENTER): Payer: Self-pay | Admitting: INTERNAL MEDICINE-ENDOCRINOLOGY-DIABETES AND METABOLISM

## 2022-06-25 ENCOUNTER — Other Ambulatory Visit: Payer: Self-pay

## 2022-06-25 ENCOUNTER — Other Ambulatory Visit
Payer: No Typology Code available for payment source | Attending: INTERNAL MEDICINE-ENDOCRINOLOGY-DIABETES AND METABOLISM

## 2022-06-25 ENCOUNTER — Ambulatory Visit
Payer: No Typology Code available for payment source | Attending: INTERNAL MEDICINE-ENDOCRINOLOGY-DIABETES AND METABOLISM | Admitting: INTERNAL MEDICINE-ENDOCRINOLOGY-DIABETES AND METABOLISM

## 2022-06-25 DIAGNOSIS — E039 Hypothyroidism, unspecified: Secondary | ICD-10-CM | POA: Insufficient documentation

## 2022-06-25 LAB — THYROID STIMULATING HORMONE (SENSITIVE TSH): TSH: 1.277 u[IU]/mL (ref 0.450–5.330)

## 2022-06-25 LAB — THYROXINE, FREE (FREE T4): THYROXINE (T4), FREE: 0.93 ng/dL (ref 0.58–1.64)

## 2022-06-25 MED ORDER — LEVOTHYROXINE 100 MCG TABLET
100.0000 ug | ORAL_TABLET | Freq: Every morning | ORAL | 0 refills | Status: DC
Start: 2022-06-25 — End: 2022-08-31

## 2022-06-25 MED ORDER — LIOTHYRONINE 5 MCG TABLET
10.0000 ug | ORAL_TABLET | Freq: Every day | ORAL | 0 refills | Status: DC
Start: 2022-06-25 — End: 2022-08-31

## 2022-06-25 NOTE — Progress Notes (Signed)
FAMILY MEDICINE, AMBULATORY [facility]  [address]  [place] [address]    Telephone Visit    Name:  GWENEVERE GOGA MRN: [id number]   Date:  06/25/2022 Age:   36 y.o.     The patient/family initiated a request for telephone service.  Verbal consent for this service was obtained from the patient/family.    Last office visit in this department: 03/25/2022      Reason for call: Hypothyroidism    Call notes:    TSH 0.4    On levo 112 and liothyronine daily    No difference in fatigue, in fact states she may feel more tired?     Decrease levothyroxine to daily, increase liothyronine to daily    Repeat labs in 6 weeks    If no improvement, she wants to try armour thyroid    06/25/2022:     TSH > 1    Fatigue improved with liothyronine daily, continue    Increase levothyroxine from 88 to daily    Reassess in 6-8 weeks    Orders Placed This Encounter    THYROID STIMULATING HORMONE (SENSITIVE TSH)    THYROXINE, FREE (FREE T4)    levothyroxine (SYNTHROID) 100 mcg Oral Tablet    liothyronine (CYTOMEL) 5 mcg Oral Tablet       ICD-10-CM    1. Hypothyroidism, unspecified type  E03.9 THYROID STIMULATING HORMONE (SENSITIVE TSH)     THYROXINE, FREE (FREE T4)        Total provider time spent with the patient on the phone: 5 minutes.    Sheryle Hail, MD  Endocrinology, Diabetes and Metabolism

## 2022-07-23 ENCOUNTER — Other Ambulatory Visit (HOSPITAL_COMMUNITY): Payer: Self-pay

## 2022-07-23 ENCOUNTER — Encounter (HOSPITAL_COMMUNITY): Payer: Self-pay

## 2022-07-23 DIAGNOSIS — R200 Anesthesia of skin: Secondary | ICD-10-CM

## 2022-08-02 ENCOUNTER — Encounter (HOSPITAL_COMMUNITY): Payer: Self-pay

## 2022-08-10 ENCOUNTER — Encounter (HOSPITAL_COMMUNITY): Payer: Self-pay

## 2022-08-10 ENCOUNTER — Inpatient Hospital Stay
Admission: RE | Admit: 2022-08-10 | Discharge: 2022-08-10 | Disposition: A | Discharge status: Home / Self Care | Payer: No Typology Code available for payment source | Source: Ambulatory Visit

## 2022-08-10 ENCOUNTER — Other Ambulatory Visit: Payer: Self-pay

## 2022-08-10 ENCOUNTER — Inpatient Hospital Stay (HOSPITAL_BASED_OUTPATIENT_CLINIC_OR_DEPARTMENT_OTHER)
Admission: RE | Admit: 2022-08-10 | Discharge: 2022-08-10 | Disposition: A | Discharge status: Home / Self Care | Payer: No Typology Code available for payment source | Source: Ambulatory Visit | Attending: FAMILY PRACTICE | Admitting: FAMILY PRACTICE

## 2022-08-10 DIAGNOSIS — Z1239 Encounter for other screening for malignant neoplasm of breast: Secondary | ICD-10-CM

## 2022-08-10 DIAGNOSIS — Z1231 Encounter for screening mammogram for malignant neoplasm of breast: Secondary | ICD-10-CM

## 2022-08-10 DIAGNOSIS — R200 Anesthesia of skin: Secondary | ICD-10-CM | POA: Insufficient documentation

## 2022-08-10 MED ORDER — GADOBUTROL 10 MMOL/10 ML (1 MMOL/ML) INTRAVENOUS SOLUTION
10.0000 mL | INTRAVENOUS | Status: AC
Start: 2022-08-10 — End: 2022-08-10
  Administered 2022-08-10: 6 mL via INTRAVENOUS

## 2022-08-30 ENCOUNTER — Emergency Department
Admission: EM | Admit: 2022-08-30 | Discharge: 2022-08-30 | Disposition: A | Discharge status: Home / Self Care | Payer: No Typology Code available for payment source | Attending: Family | Admitting: Family

## 2022-08-30 ENCOUNTER — Encounter (HOSPITAL_COMMUNITY): Payer: Self-pay | Admitting: Family

## 2022-08-30 ENCOUNTER — Other Ambulatory Visit: Payer: Self-pay

## 2022-08-30 DIAGNOSIS — U071 COVID-19: Secondary | ICD-10-CM | POA: Insufficient documentation

## 2022-08-30 LAB — COVID-19, FLU A/B, RSV RAPID BY PCR
INFLUENZA VIRUS TYPE A: NOT DETECTED
INFLUENZA VIRUS TYPE B: NOT DETECTED
RESPIRATORY SYNCTIAL VIRUS (RSV): NOT DETECTED
SARS-CoV-2: DETECTED — AB

## 2022-08-30 MED ORDER — BENZONATATE 100 MG CAPSULE
100.0000 mg | ORAL_CAPSULE | Freq: Three times a day (TID) | ORAL | 0 refills | Status: DC | PRN
Start: 2022-08-30 — End: 2023-11-25

## 2022-08-30 MED ORDER — METHYLPREDNISOLONE SOD SUCC 125 MG SOLUTION FOR INJECTION WRAPPER
125.0000 mg | INTRAVENOUS | Status: AC
Start: 2022-08-30 — End: 2022-08-30
  Administered 2022-08-30: 125 mg via INTRAMUSCULAR

## 2022-08-30 MED ORDER — METHYLPREDNISOLONE SOD SUCC 125 MG SOLUTION FOR INJECTION WRAPPER
INTRAVENOUS | Status: AC
Start: 2022-08-30 — End: 2022-08-30
  Filled 2022-08-30: qty 2

## 2022-08-30 MED ORDER — PREDNISONE 50 MG TABLET
50.0000 mg | ORAL_TABLET | Freq: Every day | ORAL | 0 refills | Status: AC
Start: 2022-08-30 — End: 2022-09-04

## 2022-08-30 NOTE — ED Triage Notes (Signed)
Cough and congestion that started last Tuesday, headache, chills.  Was started on antibiotic, last night symptoms resumed. 100.1 fever last night with productive cough.

## 2022-08-30 NOTE — ED Provider Notes (Signed)
[facility]  ED Primary Provider Note  History of Present Illness   Chief Complaint   Patient presents with    Suspected Coronavirus (Covid-19)     Jennifer Lindsey is a 36 y.o. female who had concerns including Suspected Coronavirus (Covid-19).  Arrival: The patient arrived by Car    Patient is a 36 year old to the emergency room with cough, congestion been ongoing for the past week.  Patient states she was here recently and had flu RSV swab which returned negative.  Patient states her cough has worsened over the past week and bike race swabbed for work.  Patient denies any fever at home.  Patient denies shortness of breath or any breathing problems otherwise.  Patient denies taking medication other than Tessalon Perles and cough at home.      History Reviewed This Encounter: Medical History  Surgical History  Family History  Social History    Physical Exam   ED Triage Vitals [08/30/22 1018]   BP (Non-Invasive) (!) 118/90   Heart Rate 74   Respiratory Rate 18   Temperature 36.4 C (97.5 F)   SpO2 100 %   Weight 59 kg (130 lb)   Height 1.651 m (5\' 5" )     Physical Exam  Vitals and nursing note reviewed.   Constitutional:       General: She is not in acute distress.     Appearance: She is well-developed.   HENT:      Head: Normocephalic and atraumatic.   Eyes:      Conjunctiva/sclera: Conjunctivae normal.   Cardiovascular:      Rate and Rhythm: Normal rate and regular rhythm.      Heart sounds: No murmur heard.  Pulmonary:      Effort: Pulmonary effort is normal. No respiratory distress.      Breath sounds: Normal breath sounds.   Abdominal:      Palpations: Abdomen is soft.      Tenderness: There is no abdominal tenderness.   Musculoskeletal:         General: No swelling.      Cervical back: Neck supple.   Skin:     General: Skin is warm and dry.      Capillary Refill: Capillary refill takes less than 2 seconds.   Neurological:      Mental Status: She is alert.   Psychiatric:          Mood and Affect: Mood normal.       Patient Data     Labs Ordered/Reviewed   COVID-19, FLU A/B, RSV RAPID BY PCR - Abnormal; Notable for the following components:       Result Value    SARS-CoV-2 Detected (*)     All other components within normal limits    Narrative:     Results are for the simultaneous qualitative identification of SARS-CoV-2 (formerly 2019-nCoV), Influenza A, Influenza B, and RSV RNA. These etiologic agents are generally detectable in nasopharyngeal and nasal swabs during the ACUTE PHASE of infection. Hence, this test is intended to be performed on respiratory specimens collected from individuals with signs and symptoms of upper respiratory tract infection who meet Centers for Disease Control and Prevention (CDC) clinical and/or epidemiological criteria for Coronavirus Disease 2019 (COVID-19) testing. CDC COVID-19 criteria for testing on human specimens is available at [facility] webpage information for Healthcare Professionals: Coronavirus Disease 2019 (COVID-19) ([facility]).     False-negative results may occur if the virus has genomic  mutations, insertions, deletions, or rearrangements or if performed very early in the course of illness. Otherwise, negative results indicate virus specific RNA targets are not detected, however negative results do not preclude SARS-CoV-2 infection/COVID-19, Influenza, or Respiratory syncytial virus infection. Results should not be used as the sole basis for patient management decisions. Negative results must be combined with clinical observations, patient history, and epidemiological information. If upper respiratory tract infection is still suspected based on exposure history together with other clinical findings, re-testing should be considered.    Disclaimer:   This assay has been authorized by FDA under an Emergency Use Authorization for use in laboratories certified under the Clinical Laboratory Improvement  Amendments of 1988 (CLIA), 42 U.S.C. [phone], to perform high complexity tests. The impacts of vaccines, antiviral therapeutics, antibiotics, chemotherapeutic or immunosuppressant drugs have not been evaluated.     Test methodology:   Cepheid Xpert Xpress SARS-CoV-2/Flu/RSV Assay real-time polymerase chain reaction (RT-PCR) test on the GeneXpert Dx and Xpert Xpress systems.     No orders to display     Medical Decision Making        Medical Decision Making  Patient is a 36 year old to the emergency room with cough, congestion been ongoing for the past week.  Differential diagnoses include but are not limited to COVID flu RSV viral syndrome pneumonia, strep.  On physical examination lung fields clear bilaterally.  Mild erythema to oropharynx.  Skin warm and dry no lymphadenopathy noted.  Offered chest x-ray and patient denied.  COVID flu RSV swabs sent for evaluation patient positive for COVID.  Patient given Solu-Medrol IM and prednisone pack as well as his on Perles for cough.  Patient discharged to follow up with primary care provider and return to ED if worsening symptoms.    Risk  Prescription drug management.             Medications Administered in the ED   methylPREDNISolone sod succ (SOLU-medrol) 125 mg/2 mL injection (125 mg IntraMUSCULAR Given 08/30/22 1059)     Clinical Impression   COVID (Primary)       Disposition: Discharged

## 2022-08-30 NOTE — Nurses Notes (Signed)
Patient discharged to home at this time, patient evaluated by provider.

## 2022-08-31 ENCOUNTER — Other Ambulatory Visit (RURAL_HEALTH_CENTER): Payer: Self-pay | Admitting: INTERNAL MEDICINE-ENDOCRINOLOGY-DIABETES AND METABOLISM

## 2022-08-31 DIAGNOSIS — E039 Hypothyroidism, unspecified: Secondary | ICD-10-CM

## 2022-08-31 MED ORDER — LIOTHYRONINE 5 MCG TABLET
10.0000 ug | ORAL_TABLET | Freq: Every day | ORAL | 0 refills | Status: DC
Start: 2022-08-31 — End: 2022-12-06

## 2022-08-31 MED ORDER — LEVOTHYROXINE 100 MCG TABLET
100.0000 ug | ORAL_TABLET | Freq: Every morning | ORAL | 1 refills | Status: DC
Start: 2022-08-31 — End: 2023-03-23

## 2022-08-31 NOTE — Telephone Encounter (Signed)
Patient requests refill. Nmt, lpn

## 2022-12-06 ENCOUNTER — Other Ambulatory Visit (RURAL_HEALTH_CENTER): Payer: Self-pay | Admitting: INTERNAL MEDICINE-ENDOCRINOLOGY-DIABETES AND METABOLISM

## 2022-12-06 DIAGNOSIS — E039 Hypothyroidism, unspecified: Secondary | ICD-10-CM

## 2022-12-06 NOTE — Telephone Encounter (Signed)
Patient requested refill.bnr,ma

## 2022-12-07 ENCOUNTER — Ambulatory Visit (INDEPENDENT_AMBULATORY_CARE_PROVIDER_SITE_OTHER): Payer: No Typology Code available for payment source | Admitting: Surgery

## 2022-12-07 ENCOUNTER — Other Ambulatory Visit: Payer: Self-pay

## 2022-12-07 ENCOUNTER — Encounter (INDEPENDENT_AMBULATORY_CARE_PROVIDER_SITE_OTHER): Payer: Self-pay | Admitting: Surgery

## 2022-12-07 VITALS — BP 112/79 | HR 71 | Temp 97.7°F | Resp 18 | Ht 65.0 in | Wt 130.0 lb

## 2022-12-07 DIAGNOSIS — K649 Unspecified hemorrhoids: Secondary | ICD-10-CM

## 2022-12-07 DIAGNOSIS — K588 Other irritable bowel syndrome: Secondary | ICD-10-CM

## 2022-12-07 MED ORDER — LIOTHYRONINE 5 MCG TABLET
10.0000 ug | ORAL_TABLET | Freq: Every day | ORAL | 0 refills | Status: DC
Start: 2022-12-07 — End: 2023-03-09

## 2022-12-08 ENCOUNTER — Encounter (INDEPENDENT_AMBULATORY_CARE_PROVIDER_SITE_OTHER): Payer: Self-pay | Admitting: Surgery

## 2022-12-08 NOTE — Progress Notes (Signed)
GENERAL SURGERY, [facility]  [address]  [place] [address]    History and Physical    Name: Jennifer Lindsey MRN:  [id number]   Date: 12/07/2022 DOB:  20-Mar-1986 (37 y.o.)              Reason for Visit: Hemorrhoids    History of Present Illness  [name] presents today for evaluation and management of chronic worsening hemorrhoids. She had hemorrhoid banding years ago and it worked for a while but she said that they seem to be getting more prominent again. She has chronic problems with IBS.    Negative DM, blood thinner, family history of colon cancer      Review of the result(s) of each unique test:  Patient underwent diagnostic testing ( none ) prior to this dates visit.  I have personally reviewed the results and that serves as a component of the medical decision making for this encounter       Review of prior external note(s) from each unique source:  Patients referral to this office including a recent assessment by the referring provider.  This was reviewed by me for this unique office visit for the indication and intent of the referral as well as any pertinent medical or surgical history relevant to the patients independent evaluation by me today.      Patient History  History reviewed. No pertinent past medical history.      Current Outpatient Medications   Medication Sig    amLODIPine (NORVASC) 10 mg Oral Tablet Take one tablet by mouth daily (Patient not taking: Reported on 12/07/2022)    benzonatate (TESSALON) 100 mg Oral Capsule Take 1 Capsule (100 mg total) by mouth Every 8 hours as needed for Cough (Patient not taking: Reported on 12/07/2022)    busPIRone (BUSPAR) 10 mg Oral Tablet Take one tablet by mouth daily    citalopram (CELEXA) 40 mg Oral Tablet Take one tablet by mouth daily    cyanocobalamin (VITAMIN B12) 1,000 mcg/mL Injection Solution Inject 53mL IM once a month    levothyroxine (SYNTHROID) 100 mcg Oral Tablet Take 1 Tablet (100 mcg total) by mouth Every  morning    lidocaine-prilocaine (EMLA) 2.5-2.5 % Cream Apply to affected area as directed    liothyronine (CYTOMEL) 5 mcg Oral Tablet Take 2 Tablets (10 mcg total) by mouth Once a day    losartan (COZAAR) 25 mg Oral Tablet     Norethindrone, Contraceptive, (Heather) 0.35 mg Oral tablet Take 1 tablet  by mouth once daily    ondansetron (ZOFRAN ODT) 4 mg Oral Tablet, Rapid Dissolve Dissolve 1 Tablet in mouth every 6 to 8 hours as needed    traMADoL (ULTRAM) 50 mg Oral Tablet Take 1 Tablet (50 mg total) by mouth Every 6 hours as needed    Tretinoin (RETIN-A) 0.05 % Cream Apply pea size amount to face nightly as tolerated     Allergies   Allergen Reactions    Estrogens  Other Adverse Reaction (Add comment)     Really high BP    Penicillins Rash     Family Medical History:       Problem Relation (Age of Onset)    No Known Problems Mother, Father, Sister, Brother, Maternal Grandmother, Maternal Grandfather, Paternal Grandmother, Paternal Grandfather, Daughter, Son, Maternal Aunt, Maternal Uncle, Paternal Aunt, Paternal Uncle, Other            Social History     Tobacco Use  Smoking status: Never    Smokeless tobacco: Never   Substance Use Topics    Alcohol use: No    Drug use: Never            Physical Examination:  Vitals:    12/07/22 1423   BP: 112/79   Pulse: 71   Resp: 18   Temp: 36.5 C (97.7 F)   SpO2: 100%   Weight: 59 kg (130 lb)   Height: 1.651 m (5\' 5" )   BMI: 21.68        General: appropriate for age. in no acute distress.    Vital signs are present above and have been reviewed by me     HEENT: Atraumatic, Normocephalic. PERRLA. EOMI. Nose clear. Throat clear    Lungs: Nonlabored breathing with symmetric expansion. Clear to auscultation bilaterally    Heart:Regular wth respect to rate and rythmn.    Abdomen:Soft. Nontender. Nondistended and benign    Extremities: Grossly normal. No major deformities     Neuro:  Grossly normal motor and sensory function    Psychiatric: Alert and oriented to person, place, and  time. affect appropriate      Assessment and Plan  Flexible sigmoidoscopy with possible banding of internal hemorrhoids scheduled for 01/19/23 @ 1230pm      Follow Up:  No follow-ups on file.      ICD-10-CM    1. Hemorrhoids, unspecified hemorrhoid type  K64.9           Jennifer Lindsey B Jennifer Raybourn, MD ,MBA,FACS    I appreciate the opportunity to be involved in the care of your patients.  If you have any questions or concerns regarding this encounter, please do not hesitate to contact me at your convenience.      This note may have been partially generated using MModal Fluency Direct system, and there may be some incorrect words, spellings, and punctuation that were not noted in checking the note before saving, though effort was made to avoid such errors.

## 2022-12-23 ENCOUNTER — Other Ambulatory Visit (RURAL_HEALTH_CENTER): Payer: Self-pay | Admitting: INTERNAL MEDICINE-ENDOCRINOLOGY-DIABETES AND METABOLISM

## 2022-12-23 ENCOUNTER — Encounter (RURAL_HEALTH_CENTER): Payer: Self-pay | Admitting: INTERNAL MEDICINE-ENDOCRINOLOGY-DIABETES AND METABOLISM

## 2022-12-23 DIAGNOSIS — E039 Hypothyroidism, unspecified: Secondary | ICD-10-CM

## 2022-12-27 ENCOUNTER — Other Ambulatory Visit: Payer: Self-pay

## 2022-12-27 ENCOUNTER — Other Ambulatory Visit
Payer: No Typology Code available for payment source | Attending: INTERNAL MEDICINE-ENDOCRINOLOGY-DIABETES AND METABOLISM

## 2022-12-27 DIAGNOSIS — E039 Hypothyroidism, unspecified: Secondary | ICD-10-CM | POA: Insufficient documentation

## 2022-12-27 LAB — THYROXINE, FREE (FREE T4): THYROXINE (T4), FREE: 0.87 ng/dL (ref 0.58–1.64)

## 2022-12-27 LAB — THYROID STIMULATING HORMONE (SENSITIVE TSH): TSH: 0.659 u[IU]/mL (ref 0.450–5.330)

## 2022-12-29 ENCOUNTER — Encounter (RURAL_HEALTH_CENTER): Payer: Self-pay | Admitting: INTERNAL MEDICINE-ENDOCRINOLOGY-DIABETES AND METABOLISM

## 2023-01-19 ENCOUNTER — Inpatient Hospital Stay: Admit: 2023-01-19 | Payer: No Typology Code available for payment source | Admitting: Surgery

## 2023-01-19 ENCOUNTER — Encounter (HOSPITAL_COMMUNITY): Payer: Self-pay

## 2023-01-19 SURGERY — SIGMOIDOSCOPY FLEXIBLE
Anesthesia: General

## 2023-03-09 ENCOUNTER — Other Ambulatory Visit (RURAL_HEALTH_CENTER): Payer: Self-pay | Admitting: INTERNAL MEDICINE-ENDOCRINOLOGY-DIABETES AND METABOLISM

## 2023-03-09 DIAGNOSIS — E039 Hypothyroidism, unspecified: Secondary | ICD-10-CM

## 2023-03-09 MED ORDER — LIOTHYRONINE 5 MCG TABLET
10.0000 ug | ORAL_TABLET | Freq: Every day | ORAL | 0 refills | Status: DC
Start: 2023-03-09 — End: 2023-06-24

## 2023-03-09 NOTE — Telephone Encounter (Signed)
Patient requests refill. Nmt, lpn

## 2023-03-23 ENCOUNTER — Other Ambulatory Visit (RURAL_HEALTH_CENTER): Payer: Self-pay | Admitting: INTERNAL MEDICINE-ENDOCRINOLOGY-DIABETES AND METABOLISM

## 2023-03-23 DIAGNOSIS — E039 Hypothyroidism, unspecified: Secondary | ICD-10-CM

## 2023-03-23 MED ORDER — LEVOTHYROXINE 100 MCG TABLET
100.0000 ug | ORAL_TABLET | Freq: Every morning | ORAL | 1 refills | Status: DC
Start: 2023-03-23 — End: 2023-11-25

## 2023-03-23 NOTE — Telephone Encounter (Signed)
 Patient requests refill. Nmt, lpn

## 2023-03-28 ENCOUNTER — Ambulatory Visit (RURAL_HEALTH_CENTER): Payer: Self-pay | Admitting: INTERNAL MEDICINE-ENDOCRINOLOGY-DIABETES AND METABOLISM

## 2023-05-11 ENCOUNTER — Other Ambulatory Visit: Payer: Self-pay

## 2023-05-11 ENCOUNTER — Inpatient Hospital Stay
Admission: RE | Admit: 2023-05-11 | Discharge: 2023-05-11 | Disposition: A | Discharge status: Home / Self Care | Payer: No Typology Code available for payment source | Source: Ambulatory Visit | Attending: Surgery | Admitting: Surgery

## 2023-05-11 ENCOUNTER — Encounter (HOSPITAL_COMMUNITY)
Admission: RE | Disposition: A | Discharge status: Home / Self Care | Payer: Self-pay | Source: Ambulatory Visit | Attending: Surgery

## 2023-05-11 ENCOUNTER — Ambulatory Visit (HOSPITAL_COMMUNITY): Payer: No Typology Code available for payment source | Admitting: Certified Registered"

## 2023-05-11 ENCOUNTER — Ambulatory Visit (RURAL_HEALTH_CENTER): Payer: Self-pay | Admitting: INTERNAL MEDICINE-ENDOCRINOLOGY-DIABETES AND METABOLISM

## 2023-05-11 ENCOUNTER — Encounter (HOSPITAL_COMMUNITY): Payer: No Typology Code available for payment source | Admitting: Surgery

## 2023-05-11 ENCOUNTER — Encounter (HOSPITAL_COMMUNITY): Payer: Self-pay | Admitting: Surgery

## 2023-05-11 DIAGNOSIS — K641 Second degree hemorrhoids: Secondary | ICD-10-CM | POA: Insufficient documentation

## 2023-05-11 DIAGNOSIS — I10 Essential (primary) hypertension: Secondary | ICD-10-CM | POA: Insufficient documentation

## 2023-05-11 DIAGNOSIS — K644 Residual hemorrhoidal skin tags: Secondary | ICD-10-CM | POA: Insufficient documentation

## 2023-05-11 SURGERY — SIGMOIDOSCOPY FLEXIBLE
Anesthesia: Monitor Anesthesia Care | Wound class: Clean Contaminated Wounds-The respiratory, GI, Genital, or urinary

## 2023-05-11 MED ORDER — LIDOCAINE (PF) 100 MG/5 ML (2 %) INTRAVENOUS SYRINGE
INJECTION | Freq: Once | INTRAVENOUS | Status: DC | PRN
Start: 2023-05-11 — End: 2023-05-11
  Administered 2023-05-11: 100 mg via INTRAVENOUS

## 2023-05-11 MED ORDER — LACTATED RINGERS INTRAVENOUS SOLUTION
INTRAVENOUS | Status: DC | PRN
Start: 2023-05-11 — End: 2023-05-11

## 2023-05-11 MED ORDER — DEXTROSE 5 % AND LACTATED RINGERS INTRAVENOUS SOLUTION
INTRAVENOUS | Status: DC
Start: 2023-05-11 — End: 2023-05-11

## 2023-05-11 MED ORDER — PROPOFOL 10 MG/ML IV BOLUS
INJECTION | Freq: Once | INTRAVENOUS | Status: DC | PRN
Start: 2023-05-11 — End: 2023-05-11
  Administered 2023-05-11: 20 mg via INTRAVENOUS
  Administered 2023-05-11: 30 mg via INTRAVENOUS
  Administered 2023-05-11: 70 mg via INTRAVENOUS
  Administered 2023-05-11: 30 mg via INTRAVENOUS

## 2023-05-11 SURGICAL SUPPLY — 1 items: DETERGENT INSTR 22OZ TRNSPT GEL RINSE FREE NEUT PH PREKLENZ CLR PLSNT LF (MISCELLANEOUS PT CARE ITEMS) ×1 IMPLANT

## 2023-05-11 NOTE — H&P (Signed)
[facility]  General Surgery  History and Physical    Date of Service:  05/11/2023  [name], [name], 37 y.o. female  Date of Admission:  05/11/2023  Date of Birth:  Sep 22, 1985  PCP: Earlean Shawl, DO    Reason for admission:  Flexible sigmoidoscopy    HPI:  Jennifer Lindsey is a 37 y.o. White female who is admitted for Chronic Hemorrhoids     Ms. Hammill presents today for evaluation and management of chronic worsening hemorrhoids. She had hemorrhoid banding years ago and it worked for a while but she said that they seem to be getting more prominent again. She has chronic problems with IBS.     Negative DM, blood thinner, family history of colon cancer        Review of the result(s) of each unique test:  Patient underwent diagnostic testing ( none ) prior to this dates visit.  I have personally reviewed the results and that serves as a component of the medical decision making for this encounter        Review of prior external note(s) from each unique source:  Patients referral to this office including a recent assessment by the referring provider.  This was reviewed by me for this unique office visit for the indication and intent of the referral as well as any pertinent medical or surgical history relevant to the patients independent evaluation by me today.    No past medical history on file.   No past surgical history on file.   Social History     Tobacco Use    Smoking status: Never    Smokeless tobacco: Never   Substance Use Topics    Alcohol use: No    Drug use: Never       Family Medical History:       Problem Relation (Age of Onset)    No Known Problems Mother, Father, Sister, Brother, Maternal Grandmother, Maternal Grandfather, Paternal Grandmother, Paternal Grandfather, Daughter, Son, Maternal Aunt, Maternal Uncle, Paternal Aunt, Paternal Uncle, Other           Medications Prior to Admission       Prescriptions    amLODIPine (NORVASC) 10 mg Oral Tablet    Take one tablet by mouth daily    Patient  not taking:  Reported on 12/07/2022    BD LUER-LOK SYRINGE 3 mL 25 gauge x 1" Syringe    benzonatate (TESSALON) 100 mg Oral Capsule    Take 1 Capsule (100 mg total) by mouth Every 8 hours as needed for Cough    Patient not taking:  Reported on 12/07/2022    busPIRone (BUSPAR) 10 mg Oral Tablet    Take one tablet by mouth daily    citalopram (CELEXA) 40 mg Oral Tablet    Take one tablet by mouth daily    cyanocobalamin (VITAMIN B12) 1,000 mcg/mL Injection Solution    Inject 1mL IM once a month    FLUoxetine (PROZAC) 40 mg Oral Capsule    Take 1 Capsule (40 mg total) by mouth    hydrOXYzine HCL (ATARAX) 10 mg Oral Tablet    levothyroxine (SYNTHROID) 100 mcg Oral Tablet    Take 1 Tablet (100 mcg total) by mouth Every morning    lidocaine-prilocaine (EMLA) 2.5-2.5 % Cream    Apply to affected area as directed    liothyronine (CYTOMEL) 5 mcg Oral Tablet    Take 2 Tablets (10 mcg total) by mouth Once a day  losartan (COZAAR) 25 mg Oral Tablet    minoxidiL (LONITEN) 2.5 mg Oral Tablet    Norethindrone, Contraceptive, (Heather) 0.35 mg Oral tablet    Take 1 tablet  by mouth once daily    ondansetron (ZOFRAN ODT) 4 mg Oral Tablet, Rapid Dissolve    Dissolve 1 Tablet in mouth every 6 to 8 hours as needed    traMADoL (ULTRAM) 50 mg Oral Tablet    Take 1 Tablet (50 mg total) by mouth Every 6 hours as needed    Tretinoin (RETIN-A) 0.05 % Cream    Apply pea size amount to face nightly as tolerated           Allergies   Allergen Reactions    Estrogens  Other Adverse Reaction (Add comment)     Really high BP    Penicillins Rash          No data found.       General: appropriate for age. in no acute distress.    Vital signs are present above and have been reviewed by me     HEENT: Atraumatic, Normocephalic. PERRLA, EOMI. Nose clear. Throat clear.    Lungs: Nonlabored breathing with symmetric expansion.  Clear to auscultation bilaterally    Heart:Regular wth respect to rate and rythmn.    Abdomen:Soft. Nontender. Nondistended and  benign    Extremities:  Grossly normal with good range of motion and no major deformities.    Neuro:  Grossly normal motor and sensory function. CN's II through XII intact.    Psychiatric: Alert and oriented to person, place, and time. affect appropriate    Laboratory Data:     No results found for any visits on 05/11/23 (from the past 24 hour(s)).    Imaging Studies:    No orders to display        Assessment/Plan:  Chronic Hemorrhoids    Flexible sigmoidoscopy with possible banding of internal hemorrhoids scheduled for Wednesday May 11, 2023     Discussed indications, risks, and benefits of sigmoidoscopy with possible biopsy/polypectomy with the patient.  Discussed the possibility of polypectomy, biopsies, and possible repeat examinations.  Risks include bleeding, sedation risks, possibility of missed diagnosis of polyp or malignancy, and remote possibilities of perforation and death.  All questions were answered, and informed consent was clearly obtained.    This note was partially created using voice recognition software and is inherently subject to errors including those of syntax and "sound alike " substitutions which may escape proof reading. In such instances, original meaning may be extrapolated by contextual derivation.    Jennifer Juneau, MD, MBA, FACS

## 2023-05-11 NOTE — Anesthesia Postprocedure Evaluation (Signed)
Anesthesia Post Op Evaluation    Patient: Jennifer Lindsey  Procedure(s):  FLEXIBLE SIGMOIDOSCOPY    Last Vitals:Temperature: 36.8 C (98.3 F) (05/11/23 1415)  Heart Rate: 60 (05/11/23 1415)  BP (Non-Invasive): 100/60 (05/11/23 1415)  Respiratory Rate: 16 (05/11/23 1415)  SpO2: 100 % (05/11/23 1415)    There were no known notable events for this encounter.    Patient is sufficiently recovered from the effects of anesthesia to participate in the evaluation and has returned to their pre-procedure level.  Patient location during evaluation: bedside       Patient participation: waiting for patient participation  Level of consciousness: sleepy but conscious    Pain score: 0  Pain management: adequate  Airway patency: patent    Anesthetic complications: no  Cardiovascular status: acceptable  Respiratory status: acceptable, spontaneous ventilation and unassisted  Hydration status: acceptable  Patient post-procedure temperature: Pt Normothermic   PONV Status: Absent

## 2023-05-11 NOTE — OR Surgeon (Signed)
[facility]      Patient Name: Georgean, Foltyn Number: [id number]  Date of Service: 05/11/2023   Date of Birth: 1986/03/18      Pre-Operative Diagnosis: Chronic Hemorrhoids     Post-Operative Diagnosis: Grade 2 Hemorrhoids    Procedure(s)/Description:  FLEXIBLE SIGMOIDOSCOPY: 45330 (CPT)     Attending Surgeon: Fidela Juneau, MD     Anesthesia:  CRNA: Margaretha Glassing, CRNA    Anesthesia Type: .General     Specimens Removed:  None    The patient indicates that they have read and understood the preoperative sigmoidoscopy consent form. The benefits, risks and alternatives to the procedure were discussed. I specifically discussed the risk of bleeding and/or perforation requiring operation.  The patient was given ample opportunity to ask questions which were addressed to the patient's satisfaction.  The patient indicates they have no further question and wish to proceed. Informed consent was obtained from the patient and/or medical power of attorney.  The patient was brought into the O.R. and placed on the table in the left lateral decubitus position. After digital rectal exam was performed, the flexible sigmoidoscope was inserted into the rectum and passed up into the descending colon. Exam of the colon and rectum up to this point was then performed. The scope was then withdrawn and careful exam of the colonic and rectal mucosa was performed on withdrawal of the scope. The operative findings are described as above.  The patient had grade 2 internal hemorrhoids as well as some prominent external hemorrhoidal skin but no swollen external hemorrhoids noted The patient tolerated the procedure well, no complications were encountered.    Patient will be given prescription Anusol HC to be used periodically.  Patient was also given instructions to use warm compresses and witch hazel soaked or cotton balls prn    Salomon Ganser B. Akai Dollard, MD, MBA, FACS  [facility] -General Surgery

## 2023-05-11 NOTE — Anesthesia Preprocedure Evaluation (Addendum)
ANESTHESIA PRE-OP EVALUATION  Planned Procedure: FLEXIBLE SIGMOIDOSCOPY  Review of Systems     anesthesia history negative               Pulmonary  negative pulmonary ROS,    Cardiovascular    Hypertension and well controlled , Exercise Tolerance: > or = 4 METS        GI/Hepatic/Renal   negative GI/hepatic/renal ROS,         Endo/Other    Mixed connective tissue disorder and hypothyroidism,      Neuro/Psych/MS   negative neuro/psych ROS,      Cancer                  Physical Assessment      Airway       Mallampati: I    TM distance: >3 FB    Neck ROM: full  Mouth Opening: good.  No Facial hair  No Beard  No endotracheal tube present  No Tracheostomy present    Dental                    Pulmonary    Breath sounds clear to auscultation       Cardiovascular    Rhythm: regular  Rate: Normal       Other findings          Plan  ASA 2     Planned anesthesia type: MAC                         Intravenous induction     Anesthesia issues/risks discussed are: Dental Injuries, Post-op Intubation/Ventilation, Stroke, Aspiration, Post-op Cognitive Dysfunction, Cardiac Events/MI, Intraoperative Awareness/ Recall and Blood Loss.  Anesthetic plan and risks discussed with patient  signed consent obtained      Use of blood products discussed with who consented to blood products.      Patient's NPO status is appropriate for Anesthesia.           Plan discussed with CRNA.

## 2023-05-11 NOTE — Anesthesia Transfer of Care (Signed)
ANESTHESIA TRANSFER OF CARE   Jennifer Lindsey is a 37 y.o. ,female, Weight: 59.9 kg (132 lb)   had Procedure(s):  FLEXIBLE SIGMOIDOSCOPY  performed  05/11/23   Primary Service: Fidela Juneau, MD    History reviewed. No pertinent past medical history.   Allergy History as of 05/11/23       PENICILLINS         Noted Status Severity Type Reaction    12/18/08 Haddix, Malakoff, MA 12/18/08 Active   Rash              ESTROGENS         Noted Status Severity Type Reaction    12/18/08 Baker Pierini, MA 12/18/08 Active High   Other Adverse Reaction (Add comment)    Comments: Really high BP                   I completed my transfer of care / handoff to the receiving personnel during which we discussed:  Access, Airway, All key/critical aspects of case discussed, Analgesia, Antibiotics, Expectation of post procedure, Fluids/Product, Gave opportunity for questions and acknowledgement of understanding, Labs and PMHx  Report given to: ReddenDot Lanes, RN    Post Location: PACU                                                           Last OR Temp: Temperature: 36.8 C (98.3 F)  ABG:  POTASSIUM   Date Value Ref Range Status   03/18/2008 4.0 3.5 - 5.1 mmol/L Final     KETONES   Date Value Ref Range Status   05/02/2008 NEGATIVE NEGATIVE mg/dL Final     CALCIUM   Date Value Ref Range Status   03/18/2008 9.2 8.5 - 10.4 mg/dL Final     CASTS   Date Value Ref Range Status   11/15/2007 NONE SEEN 0 - 2.5 /LPF Final     Airway:* No LDAs found *  Blood pressure 100/60, pulse 60, temperature 36.8 C (98.3 F), resp. rate 16, height 1.651 m (5\' 5" ), weight 59.9 kg (132 lb), SpO2 100%.

## 2023-05-11 NOTE — Discharge Instructions (Addendum)
SURGICAL DISCHARGE INSTRUCTIONS     Dr. Donnal Debar, Gene B, MD  performed your FLEXIBLE SIGMOIDOSCOPY today at the [facility]    Findings;Grade 2 Hemorrhoids     [facility]:  Monday through Friday from 8 a.m. - 4 p.m.: [phone]    For T&D: [phone]  Between 4 p.m. - 8 a.m., weekends and holidays:  Call ER [phone]    PLEASE SEE WRITTEN HANDOUTS AS DISCUSSED BY YOUR NURSE:  Sunday Spillers    SIGNS AND SYMPTOMS OF A WOUND / INCISION INFECTION   Be sure to watch for the following:  Increase in redness or red streaks near or around the wound or incision.  Increase in pain that is intense or severe and cannot be relieved by the pain medication that your doctor has given you.  Increase in swelling that cannot be relieved by elevation of a body part, or by applying ice, if permitted.  Increase in drainage, or if yellow / green in color and smells bad. This could be on a dressing or a cast.  Increase in fever for longer than 24 hours, or an increase that is higher than 101 degrees Fahrenheit (normal body temperature is 98 degrees Fahrenheit). The incision may feel warm to the touch.    **CALL YOUR DOCTOR IF ONE OR MORE OF THESE SIGNS / SYMPTOMS SHOULD OCCUR.    ANESTHESIA INFORMATION   ANESTHESIA -- ADULT PATIENTS:  You have received intravenous sedation / general anesthesia, and you may feel drowsy and light-headed for several hours. You may even experience some forgetfulness of the procedure. DO NOT DRIVE A MOTOR VEHICLE or perform any activity requiring complete alertness or coordination until you feel fully awake in about 24-48 hours. Do not drink alcoholic beverages for at least 24 hours. Do not stay alone, you must have a responsible adult available to be with you. You may also experience a dry mouth or nausea for 24 hours. This is a normal side effect and will disappear as the effects of the medication wear off.    REMEMBER   If you experience any difficulty breathing,  chest pain, bleeding that you feel is excessive, persistent nausea or vomiting or for any other concerns:  Call your physician Dr.  Donnal Debar, Cherylann Parr, MD   at [phone] . You may also ask to have the general doctor on call paged. They are available to you 24 hours a day.      SPECIAL INSTRUCTIONS / COMMENTS   No driving , cooking, cleaning or operating machinery today, you may resume normal activities tomorrow. You may eat a regular diet as tolerated today.     FOLLOW-UP APPOINTMENTS   Follow-up as needed with Dr. Nira Retort. Colonoscopy recommended in 10 years.

## 2023-06-01 ENCOUNTER — Other Ambulatory Visit: Payer: Self-pay

## 2023-06-24 ENCOUNTER — Other Ambulatory Visit (RURAL_HEALTH_CENTER): Payer: Self-pay | Admitting: INTERNAL MEDICINE-ENDOCRINOLOGY-DIABETES AND METABOLISM

## 2023-06-24 DIAGNOSIS — E039 Hypothyroidism, unspecified: Secondary | ICD-10-CM

## 2023-06-24 NOTE — Telephone Encounter (Signed)
Pharmacy requested refill.[email or web address],ma

## 2023-08-25 ENCOUNTER — Ambulatory Visit (RURAL_HEALTH_CENTER)
Payer: No Typology Code available for payment source | Admitting: INTERNAL MEDICINE-ENDOCRINOLOGY-DIABETES AND METABOLISM

## 2023-09-19 ENCOUNTER — Other Ambulatory Visit (RURAL_HEALTH_CENTER): Payer: Self-pay | Admitting: INTERNAL MEDICINE-ENDOCRINOLOGY-DIABETES AND METABOLISM

## 2023-09-19 DIAGNOSIS — E039 Hypothyroidism, unspecified: Secondary | ICD-10-CM

## 2023-09-19 NOTE — Telephone Encounter (Signed)
Pharmacy requested refill.[email or web address],ma

## 2023-10-17 ENCOUNTER — Ambulatory Visit
Payer: No Typology Code available for payment source | Attending: OBSTETRICS/GYNECOLOGY | Admitting: OBSTETRICS/GYNECOLOGY

## 2023-10-17 DIAGNOSIS — N938 Other specified abnormal uterine and vaginal bleeding: Secondary | ICD-10-CM | POA: Insufficient documentation

## 2023-10-20 DIAGNOSIS — N938 Other specified abnormal uterine and vaginal bleeding: Secondary | ICD-10-CM

## 2023-10-20 LAB — SURGICAL PATHOLOGY SPECIMEN

## 2023-11-24 ENCOUNTER — Encounter (HOSPITAL_COMMUNITY): Payer: Self-pay | Admitting: OBSTETRICS/GYNECOLOGY

## 2023-11-24 ENCOUNTER — Ambulatory Visit: Attending: OBSTETRICS/GYNECOLOGY

## 2023-11-24 ENCOUNTER — Other Ambulatory Visit: Payer: Self-pay

## 2023-11-24 DIAGNOSIS — Z01818 Encounter for other preprocedural examination: Secondary | ICD-10-CM | POA: Insufficient documentation

## 2023-11-24 LAB — CBC
HCT: 41.1 % (ref 31.2–41.9)
HGB: 13.9 g/dL (ref 10.9–14.3)
MCH: 29.9 pg (ref 24.7–32.8)
MCHC: 33.7 g/dL (ref 32.3–35.6)
MCV: 88.7 fL (ref 75.5–95.3)
MPV: 8.7 fL (ref 7.9–10.8)
PLATELETS: 231 10*3/uL (ref 140–440)
RBC: 4.63 10*6/uL (ref 3.63–4.92)
RDW: 13.3 % (ref 12.3–17.7)
WBC: 5.7 10*3/uL (ref 3.8–11.8)

## 2023-11-24 LAB — HCG, URINE QUALITATIVE, PREGNANCY: HCG URINE QUALITATIVE: NEGATIVE

## 2023-11-25 ENCOUNTER — Encounter (HOSPITAL_COMMUNITY): Payer: Self-pay | Admitting: OBSTETRICS/GYNECOLOGY

## 2023-11-25 ENCOUNTER — Encounter (HOSPITAL_COMMUNITY)
Admission: RE | Disposition: A | Discharge status: Home / Self Care | Payer: Self-pay | Source: Ambulatory Visit | Attending: OBSTETRICS/GYNECOLOGY

## 2023-11-25 ENCOUNTER — Ambulatory Visit (HOSPITAL_COMMUNITY): Admitting: Anesthesiology

## 2023-11-25 ENCOUNTER — Ambulatory Visit
Admission: RE | Admit: 2023-11-25 | Discharge: 2023-11-25 | Disposition: A | Discharge status: Home / Self Care | Payer: Self-pay | Source: Ambulatory Visit | Attending: OBSTETRICS/GYNECOLOGY | Admitting: OBSTETRICS/GYNECOLOGY

## 2023-11-25 ENCOUNTER — Ambulatory Visit (HOSPITAL_COMMUNITY): Admitting: OBSTETRICS/GYNECOLOGY

## 2023-11-25 DIAGNOSIS — D259 Leiomyoma of uterus, unspecified: Secondary | ICD-10-CM | POA: Insufficient documentation

## 2023-11-25 DIAGNOSIS — Z87442 Personal history of urinary calculi: Secondary | ICD-10-CM | POA: Insufficient documentation

## 2023-11-25 DIAGNOSIS — N920 Excessive and frequent menstruation with regular cycle: Secondary | ICD-10-CM | POA: Insufficient documentation

## 2023-11-25 DIAGNOSIS — T753XXA Motion sickness, initial encounter: Secondary | ICD-10-CM | POA: Insufficient documentation

## 2023-11-25 DIAGNOSIS — M539 Dorsopathy, unspecified: Secondary | ICD-10-CM | POA: Insufficient documentation

## 2023-11-25 DIAGNOSIS — E039 Hypothyroidism, unspecified: Secondary | ICD-10-CM | POA: Insufficient documentation

## 2023-11-25 DIAGNOSIS — E785 Hyperlipidemia, unspecified: Secondary | ICD-10-CM | POA: Insufficient documentation

## 2023-11-25 DIAGNOSIS — I10 Essential (primary) hypertension: Secondary | ICD-10-CM | POA: Insufficient documentation

## 2023-11-25 DIAGNOSIS — Z302 Encounter for sterilization: Secondary | ICD-10-CM | POA: Insufficient documentation

## 2023-11-25 HISTORY — DX: Hypothyroidism, unspecified: E03.9

## 2023-11-25 HISTORY — DX: Other chronic pain: G89.29

## 2023-11-25 HISTORY — DX: Presence of spectacles and contact lenses: Z97.3

## 2023-11-25 HISTORY — DX: Systemic lupus erythematosus, unspecified: M32.9

## 2023-11-25 HISTORY — DX: Motion sickness, initial encounter: T75.3XXA

## 2023-11-25 HISTORY — DX: Unspecified osteoarthritis, unspecified site: M19.90

## 2023-11-25 HISTORY — DX: Coagulation defect, unspecified: D68.9

## 2023-11-25 HISTORY — DX: Calculus of kidney: N20.0

## 2023-11-25 HISTORY — DX: Hyperlipidemia, unspecified: E78.5

## 2023-11-25 HISTORY — DX: Dorsopathy, unspecified: M53.9

## 2023-11-25 SURGERY — SALPINGECTOMY LAPAROSCOPIC
Anesthesia: General | Site: Vagina | Laterality: Bilateral | Wound class: Clean Contaminated Wounds-The respiratory, GI, Genital, or urinary

## 2023-11-25 MED ORDER — PROCHLORPERAZINE EDISYLATE 10 MG/2 ML (5 MG/ML) INJECTION SOLUTION
5.0000 mg | Freq: Once | INTRAMUSCULAR | Status: DC | PRN
Start: 2023-11-25 — End: 2023-11-25

## 2023-11-25 MED ORDER — SUGAMMADEX 100 MG/ML INTRAVENOUS SOLUTION
Freq: Once | INTRAVENOUS | Status: DC | PRN
Start: 2023-11-25 — End: 2023-11-25
  Administered 2023-11-25: 150 mg via INTRAVENOUS

## 2023-11-25 MED ORDER — FENTANYL (PF) 50 MCG/ML INJECTION WRAPPER
25.0000 ug | INJECTION | INTRAMUSCULAR | Status: DC | PRN
Start: 2023-11-25 — End: 2023-11-25
  Administered 2023-11-25 (×2): 25 ug via INTRAVENOUS

## 2023-11-25 MED ORDER — ONDANSETRON HCL (PF) 4 MG/2 ML INJECTION SOLUTION
INTRAMUSCULAR | Status: AC
Start: 2023-11-25 — End: 2023-11-25
  Filled 2023-11-25: qty 2

## 2023-11-25 MED ORDER — LACTATED RINGERS INTRAVENOUS SOLUTION
INTRAVENOUS | Status: DC
Start: 2023-11-25 — End: 2023-11-25

## 2023-11-25 MED ORDER — SODIUM CHLORIDE 0.9 % (FLUSH) INJECTION SYRINGE
3.0000 mL | INJECTION | INTRAMUSCULAR | Status: DC | PRN
Start: 2023-11-25 — End: 2023-11-25

## 2023-11-25 MED ORDER — SCOPOLAMINE 1 MG OVER 3 DAYS TRANSDERMAL PATCH
MEDICATED_PATCH | TRANSDERMAL | Status: AC
Start: 2023-11-25 — End: 2023-11-25
  Filled 2023-11-25: qty 1

## 2023-11-25 MED ORDER — CLINDAMYCIN 900 MG/50 ML IN 0.9% SODIUM CHLORIDE INTRAVENOUS PIGGYBACK
INJECTION | INTRAVENOUS | Status: AC
Start: 2023-11-25 — End: 2023-11-25
  Filled 2023-11-25: qty 50

## 2023-11-25 MED ORDER — MIDAZOLAM 5 MG/ML INJECTION WRAPPER
INTRAMUSCULAR | Status: AC
Start: 2023-11-25 — End: 2023-11-25
  Filled 2023-11-25: qty 1

## 2023-11-25 MED ORDER — SODIUM CHLORIDE 0.9 % (FLUSH) INJECTION SYRINGE
3.0000 mL | INJECTION | Freq: Three times a day (TID) | INTRAMUSCULAR | Status: DC
Start: 2023-11-25 — End: 2023-11-25

## 2023-11-25 MED ORDER — ALBUTEROL SULFATE 2.5 MG/3 ML (0.083 %) SOLUTION FOR NEBULIZATION
2.5000 mg | INHALATION_SOLUTION | Freq: Once | RESPIRATORY_TRACT | Status: DC | PRN
Start: 2023-11-25 — End: 2023-11-25

## 2023-11-25 MED ORDER — LACTATED RINGERS INTRAVENOUS SOLUTION
INTRAVENOUS | Status: DC
Start: 2023-11-25 — End: 2023-11-25
  Administered 2023-11-25: 0 via INTRAVENOUS

## 2023-11-25 MED ORDER — KETOROLAC 30 MG/ML (1 ML) INJECTION SOLUTION
Freq: Once | INTRAMUSCULAR | Status: DC | PRN
Start: 2023-11-25 — End: 2023-11-25
  Administered 2023-11-25: 30 mg via INTRAVENOUS

## 2023-11-25 MED ORDER — IBUPROFEN 800 MG TABLET
800.0000 mg | ORAL_TABLET | Freq: Three times a day (TID) | ORAL | 5 refills | Status: DC | PRN
Start: 2023-11-25 — End: 2024-06-25

## 2023-11-25 MED ORDER — OXYCODONE-ACETAMINOPHEN 7.5 MG-325 MG TABLET
1.0000 | ORAL_TABLET | Freq: Four times a day (QID) | ORAL | 0 refills | Status: DC | PRN
Start: 2023-11-25 — End: 2024-06-25

## 2023-11-25 MED ORDER — FAMOTIDINE (PF) 20 MG/2 ML INTRAVENOUS SOLUTION
INTRAVENOUS | Status: AC
Start: 2023-11-25 — End: 2023-11-25
  Filled 2023-11-25: qty 2

## 2023-11-25 MED ORDER — EPHEDRINE SULFATE 50 MG/ML INTRAVENOUS SOLUTION
Freq: Once | INTRAVENOUS | Status: DC | PRN
Start: 2023-11-25 — End: 2023-11-25
  Administered 2023-11-25 (×3): 10 mg via INTRAVENOUS

## 2023-11-25 MED ORDER — FENTANYL (PF) 50 MCG/ML INJECTION WRAPPER
50.0000 ug | INJECTION | INTRAMUSCULAR | Status: DC | PRN
Start: 2023-11-25 — End: 2023-11-25
  Administered 2023-11-25: 50 ug via INTRAVENOUS

## 2023-11-25 MED ORDER — APREPITANT 40 MG CAPSULE
40.0000 mg | ORAL_CAPSULE | Freq: Once | ORAL | Status: AC
Start: 2023-11-25 — End: 2023-11-25
  Administered 2023-11-25: 40 mg via ORAL

## 2023-11-25 MED ORDER — OXYCODONE-ACETAMINOPHEN 5 MG-325 MG TABLET
1.0000 | ORAL_TABLET | ORAL | Status: DC | PRN
Start: 2023-11-25 — End: 2023-11-25
  Administered 2023-11-25: 1 via ORAL
  Filled 2023-11-25: qty 1

## 2023-11-25 MED ORDER — DEXMEDETOMIDINE 100 MCG/ML INTRAVENOUS SOLUTION
Freq: Once | INTRAVENOUS | Status: DC | PRN
Start: 2023-11-25 — End: 2023-11-25
  Administered 2023-11-25 (×3): 4 ug via INTRAVENOUS

## 2023-11-25 MED ORDER — FENTANYL (PF) 50 MCG/ML INJECTION SOLUTION
INTRAMUSCULAR | Status: AC
Start: 2023-11-25 — End: 2023-11-25
  Filled 2023-11-25: qty 2

## 2023-11-25 MED ORDER — PROMETHAZINE 25 MG TABLET
25.0000 mg | ORAL_TABLET | Freq: Four times a day (QID) | ORAL | Status: DC | PRN
Start: 2023-11-25 — End: 2023-11-25

## 2023-11-25 MED ORDER — ONDANSETRON HCL (PF) 4 MG/2 ML INJECTION SOLUTION
4.0000 mg | Freq: Once | INTRAMUSCULAR | Status: AC
Start: 2023-11-25 — End: 2023-11-25
  Administered 2023-11-25: 4 mg via INTRAVENOUS

## 2023-11-25 MED ORDER — IBUPROFEN 600 MG TABLET
600.0000 mg | ORAL_TABLET | Freq: Four times a day (QID) | ORAL | Status: DC | PRN
Start: 2023-11-25 — End: 2023-11-25
  Administered 2023-11-25: 600 mg via ORAL
  Filled 2023-11-25: qty 1

## 2023-11-25 MED ORDER — ONDANSETRON HCL (PF) 4 MG/2 ML INJECTION SOLUTION
4.0000 mg | Freq: Four times a day (QID) | INTRAMUSCULAR | Status: DC | PRN
Start: 2023-11-25 — End: 2023-11-25

## 2023-11-25 MED ORDER — APREPITANT 40 MG CAPSULE
ORAL_CAPSULE | ORAL | Status: AC
Start: 2023-11-25 — End: 2023-11-25
  Filled 2023-11-25: qty 1

## 2023-11-25 MED ORDER — MIDAZOLAM 5 MG/ML INJECTION WRAPPER
Freq: Once | INTRAMUSCULAR | Status: DC | PRN
Start: 2023-11-25 — End: 2023-11-25
  Administered 2023-11-25: 2 mg via INTRAVENOUS

## 2023-11-25 MED ORDER — IPRATROPIUM 0.5 MG-ALBUTEROL 3 MG (2.5 MG BASE)/3 ML NEBULIZATION SOLN
3.0000 mL | INHALATION_SOLUTION | Freq: Once | RESPIRATORY_TRACT | Status: DC | PRN
Start: 2023-11-25 — End: 2023-11-25

## 2023-11-25 MED ORDER — HYDROMORPHONE 2 MG/ML INJECTION WRAPPER
2.0000 mg | INJECTION | INTRAMUSCULAR | Status: DC | PRN
Start: 2023-11-25 — End: 2023-11-25

## 2023-11-25 MED ORDER — FAMOTIDINE (PF) 20 MG/2 ML INTRAVENOUS SOLUTION
20.0000 mg | Freq: Once | INTRAVENOUS | Status: AC
Start: 2023-11-25 — End: 2023-11-25
  Administered 2023-11-25: 20 mg via INTRAVENOUS

## 2023-11-25 MED ORDER — PROPOFOL 10 MG/ML IV BOLUS
INJECTION | Freq: Once | INTRAVENOUS | Status: DC | PRN
Start: 2023-11-25 — End: 2023-11-25
  Administered 2023-11-25: 150 mg via INTRAVENOUS

## 2023-11-25 MED ORDER — MORPHINE 2 MG/ML INJECTION WRAPPER
2.0000 mg | INJECTION | INTRAMUSCULAR | Status: DC | PRN
Start: 2023-11-25 — End: 2023-11-25

## 2023-11-25 MED ORDER — CLINDAMYCIN 900 MG/50 ML IN 0.9% SODIUM CHLORIDE INTRAVENOUS PIGGYBACK
900.0000 mg | INJECTION | Freq: Once | INTRAVENOUS | Status: AC
Start: 2023-11-25 — End: 2023-11-25
  Administered 2023-11-25: 900 mg via INTRAVENOUS

## 2023-11-25 MED ORDER — ROCURONIUM 10 MG/ML INTRAVENOUS SOLUTION
Freq: Once | INTRAVENOUS | Status: DC | PRN
Start: 2023-11-25 — End: 2023-11-25
  Administered 2023-11-25: 40 mg via INTRAVENOUS

## 2023-11-25 MED ORDER — DEXMEDETOMIDINE 100 MCG/ML INTRAVENOUS SOLUTION
INTRAVENOUS | Status: AC
Start: 2023-11-25 — End: 2023-11-25
  Filled 2023-11-25: qty 2

## 2023-11-25 MED ORDER — SCOPOLAMINE 1 MG OVER 3 DAYS TRANSDERMAL PATCH
1.0000 | MEDICATED_PATCH | Freq: Once | TRANSDERMAL | Status: DC
Start: 2023-11-25 — End: 2023-11-25
  Administered 2023-11-25: 1 via TRANSDERMAL

## 2023-11-25 MED ORDER — GLYCOPYRROLATE 0.2 MG/ML INJECTION SOLUTION
Freq: Once | INTRAMUSCULAR | Status: DC | PRN
Start: 2023-11-25 — End: 2023-11-25
  Administered 2023-11-25: .2 mg via INTRAVENOUS

## 2023-11-25 MED ORDER — ONDANSETRON 8 MG DISINTEGRATING TABLET
8.0000 mg | ORAL_TABLET | Freq: Three times a day (TID) | ORAL | 5 refills | Status: AC | PRN
Start: 2023-11-25 — End: ?

## 2023-11-25 MED ORDER — ONDANSETRON HCL (PF) 4 MG/2 ML INJECTION SOLUTION
4.0000 mg | Freq: Once | INTRAMUSCULAR | Status: DC | PRN
Start: 2023-11-25 — End: 2023-11-25
  Administered 2023-11-25: 4 mg via INTRAVENOUS

## 2023-11-25 MED ORDER — DEXAMETHASONE SODIUM PHOSPHATE 4 MG/ML INJECTION SOLUTION
4.0000 mg | Freq: Once | INTRAMUSCULAR | Status: AC
Start: 2023-11-25 — End: 2023-11-25
  Administered 2023-11-25: 4 mg via INTRAVENOUS

## 2023-11-25 MED ORDER — KETOROLAC 30 MG/ML (1 ML) INJECTION SOLUTION
30.0000 mg | Freq: Four times a day (QID) | INTRAMUSCULAR | Status: DC | PRN
Start: 2023-11-25 — End: 2023-11-25

## 2023-11-25 MED ORDER — DEXAMETHASONE SODIUM PHOSPHATE 4 MG/ML INJECTION SOLUTION
INTRAMUSCULAR | Status: AC
Start: 2023-11-25 — End: 2023-11-25
  Filled 2023-11-25: qty 1

## 2023-11-25 MED ORDER — LIDOCAINE (PF) 100 MG/5 ML (2 %) INTRAVENOUS SYRINGE
INJECTION | Freq: Once | INTRAVENOUS | Status: DC | PRN
Start: 2023-11-25 — End: 2023-11-25
  Administered 2023-11-25: 60 mg via INTRAVENOUS

## 2023-11-25 MED ORDER — FENTANYL (PF) 50 MCG/ML INJECTION WRAPPER
INJECTION | Freq: Once | INTRAMUSCULAR | Status: DC | PRN
Start: 2023-11-25 — End: 2023-11-25
  Administered 2023-11-25 (×2): 50 ug via INTRAVENOUS

## 2023-11-25 MED ORDER — ALUMINUM-MAG HYDROXIDE-SIMETHICONE 200 MG-200 MG-20 MG/5 ML ORAL SUSP
30.0000 mL | Freq: Four times a day (QID) | ORAL | Status: DC | PRN
Start: 2023-11-25 — End: 2023-11-25

## 2023-11-25 MED ORDER — BUPIVACAINE HCL 0.25 % (2.5 MG/ML) INJECTION SOLUTION
Freq: Once | INTRAMUSCULAR | Status: DC | PRN
Start: 2023-11-25 — End: 2023-11-25
  Administered 2023-11-25: 20 mL via INTRAMUSCULAR

## 2023-11-25 SURGICAL SUPPLY — 45 items
ABLATION MINERVA SYSTEM DISP (SURGICAL CUTTING SUPPLIES) ×2 IMPLANT
ADH LIQUID LF  WTPRF VIAL PREP NONSTAIN MASTISOL STYRAX GUM MASTIC ALC MTHY SLCYT STRL CLR NHZR 2/3 (WOUND CARE SUPPLY) ×2 IMPLANT
BLADE 11 2 END CBNSTL SURG STRL DISP (SURGICAL CUTTING SUPPLIES) ×4 IMPLANT
BLADE SURG CLPR W 37.2MM GP EXIST HNDL GTT IN CHRG .23MM NONST LF  DISP (MED SURG SUPPLIES) IMPLANT
CANNULA LAPSCP 5MM 100MM VERSAONE STD UNIV FIX BLDLS DLPHN NOSE TIP LOW PROF STRL LF  DISP (ENDOSCOPIC SUPPLIES) ×2 IMPLANT
CATH URETH CLEAN 14FR 16IN UNIV INTMT SMOOTH TIP FNL END STGR EYE VNYL STRL LF  CLR (UROLOGICAL SUPPLIES) ×2 IMPLANT
CLEANER INSTRUMENT PRE-KLENZ 13.5 OZ (MISCELLANEOUS PT CARE ITEMS) ×2 IMPLANT
CONN TUBING 6-15MM Y POLYPROP SHTRPRF STRL LF  DISP (MED SURG SUPPLIES) ×2 IMPLANT
CONTAINR CLICKSEAL 4OZ TRANSLUC SCREW CAP STRL BLU SPECI PNEUM TUBE SYS (SPECIMEN COLLECTION SUPPLIES) ×6 IMPLANT
CONV USE ITEM 107364 - COVER STAND 54X23IN MAYO REG FBRC REINF STD TLSCP FOLD STRL (DRAPE/PACKS/SHEETS/OR TOWEL) ×2 IMPLANT
COUNTER 20 CNT BLOCK ADH NEEDLE STRL LF  RD SHARP FOAM 15.75X11.5X14IN DISP (MED SURG SUPPLIES) ×2 IMPLANT
COVER EQP 90X60IN HVDTY BCK PAD FNFLD BLU STRL (DRAPE/PACKS/SHEETS/OR TOWEL) ×2 IMPLANT
DRAPE SURG KC100 LAP CHOLE REINF ARMBRD CVR HKLP TUBE HLDR FEN 104X76X120IN STD BSC STRL (DRAPE/PACKS/SHEETS/OR TOWEL) ×2 IMPLANT
ELECTRODE HANDPIECE 5MMX37CM LIGASURE XP ELCTRSRG ENDO (SURGICAL CUTTING SUPPLIES) ×2 IMPLANT
GLOVE SURG 7 LF  PF BEAD CUF STRL CRM 11.8IN PROTEXIS PI PLISPRN THK9.1 MIL (GLOVES AND ACCESSORIES) ×4 IMPLANT
GLOVE SURG 7 LF  PF SMOOTH BEAD CUF INTLK STRL BLU 11.8IN PROTEXIS NEU-THERA PLISPRN THK7.9 MIL (GLOVES AND ACCESSORIES) ×2 IMPLANT
GLOVE SURG 7.5 LF  PF BEAD CUF STRL CRM 11.8IN PROTEXIS PI PLISPRN THK9.1 MIL (GLOVES AND ACCESSORIES) ×4 IMPLANT
GLOVE SURG 8.5 LF  PF BEAD CUF STRL CRM 11.8IN PROTEXIS PI PLISPRN THK9.1 MIL (GLOVES AND ACCESSORIES) ×2 IMPLANT
GOWN SURG LRG STD LGTH L3 HKLP CLSR RGLN SLEEVE TWL STRL LF  DISP GRN AERO BLU PRFRM FBRC (DRAPE/PACKS/SHEETS/OR TOWEL) ×2 IMPLANT
GOWN SURG XL STD LGTH L3 HKLP CLSR RGLN SLEEVE TWL STRL LF  DISP GRN AERO BLU PRFRM FBRC (DRAPE/PACKS/SHEETS/OR TOWEL) ×2 IMPLANT
IRR SUCT 10FT STRKFL TUBE 2 SPIKE STRL LF  DISP (ENDOSCOPIC SUPPLIES) ×2 IMPLANT
LABEL MED CORRECT MED LABELING SYS 4 FLG 2 SHEET 24 PRPRNT STRL (MED SURG SUPPLIES) ×2 IMPLANT
NEEDLE HYPO  18GA 1.5IN REG WL BD PRCSNGL POLYPROP REG BVL LL HUB CLR CD DEHP-FR STRL LF  DISP (MED SURG SUPPLIES) ×2 IMPLANT
NEEDLE HYPO  21GA 1.5IN TW SFGLD POLYPROP REG BVL LL SHIELD MECH DEHP-FR IM GRN STRL LF  DISP (MED SURG SUPPLIES) ×2 IMPLANT
NEEDLE INSFL 120MM 14GA STD SRGNDL PNMPRTN SPRG LD BLUNT STY STRL LF  DISP SS PLASTIC RD (ENDOSCOPIC SUPPLIES) ×2 IMPLANT
PACK SURG OB IV ABDOMINAL DRP ABS TWL LEGGING STRL DISP 60X38IN 22X15IN LF (CUSTOM TRAYS & PACK) ×2 IMPLANT
PAD DRESS 8X3IN MDCHC NONADH NWVN LF  STRL DISP WHT (WOUND CARE SUPPLY) ×2 IMPLANT
SET ENDOS INSTR MYOSURE SEAL HYSCP OFLW CHNL DISP (ENDOSCOPIC SUPPLIES) ×2 IMPLANT
SET TUBING PNEUMOCLEAR HIFLO SMOKE EVAC (MED SURG SUPPLIES) ×2 IMPLANT
SOL ANFG DFGR ISOPRPNL PAD OVAL BTL NABRSV ADH STRL LF  DISP (ENDOSCOPIC SUPPLIES) ×2 IMPLANT
SOL IRRG 0.9% NACL 1000ML PLASTIC PR BTL ISTNC N-PYRG STRL LF (MEDICATIONS/SOLUTIONS) ×2 IMPLANT
SOL IV 0.9% NACL 1000ML STRL PRSV FR FLXB CONTAINR LF (MEDICATIONS/SOLUTIONS) ×2 IMPLANT
SOL PREP 10% PVP IOD 4OZ ANSEP SCREW TOP BTL LIQUID LF  DRK BRN (MED SURG SUPPLIES) ×2 IMPLANT
SPONGE SURG 4X4IN 16 PLY XRY DETECT COTTON STRL LF  DISP (WOUND CARE SUPPLY) IMPLANT
STRIP 4X.5IN STRSTRP PLSTR REINF SKNCLS WHT STRL LF (WOUND CARE SUPPLY) ×2 IMPLANT
SUTURE 4-0 P-12 POLYSRB SURGALLOY 18IN UNDYED BRD COAT ABS (SUTURE/WOUND CLOSURE) ×2 IMPLANT
SUTURE CHR 3-0 SH 27IN BRN MONOF ABS (SUTURE/WOUND CLOSURE) ×2 IMPLANT
SUTURE CHR 3-0 V-20 30IN ABS (SUTURE/WOUND CLOSURE) IMPLANT
SYRINGE LL 10ML LF  STRL GRAD N-PYRG DEHP-FR PVC FREE MED DISP (MED SURG SUPPLIES) ×2 IMPLANT
SYRINGE LL 20ML STRL GRAD MED DISP (MED SURG SUPPLIES) ×2 IMPLANT
TOWEL 24X16IN COTTON BLU DISP SURG STRL LF (DRAPE/PACKS/SHEETS/OR TOWEL) ×4 IMPLANT
TROCAR LAPSCP STD 100MM 5MM VERSAONE FIX CANN BLADE STRL LF  DISP (ENDOSCOPIC SUPPLIES) ×2 IMPLANT
TROCAR LAPSCP STD 100MM 8MM VERSAONE FIX CANN BLDLS DLPHN NOSE TIP STRL LF  DISP (ENDOSCOPIC SUPPLIES) ×2 IMPLANT
TUBING SUCT CLR 6FT .25IN ARGYLE PVC NCDTV STR MALE FEMALE MLD CONN STRL LF (MED SURG SUPPLIES) ×4 IMPLANT
WATER STRL 1000ML PLASTIC PR BTL LF (MED SURG SUPPLIES) ×2 IMPLANT

## 2023-11-25 NOTE — Anesthesia Procedure Notes (Signed)
 [name]    Airway Note  General Information and Staff   Authorizing provider: Cornett, Larwance Poe, MD  Performing provider: Smith Dunk, CRNA        Urgency: elective    Airway not difficult    Indications and Patient Condition  Pt location: In Or  Indications for airway management: anesthesia  Spontaneous Ventilation: absent  Sedation level: deep  Preoxygenated: yes  Patient position: sniffing  Mask difficulty assessment: 1 - vent by mask        Final Airway Details  Final airway type: endotracheal airway        Successful airway: ETT and cuffed     Successful intubation technique: direct laryngoscopy  Facilitating devices/methods: intubating stylet              Endotracheal tube insertion site: oral  Blade: Macintosh  Blade size: #3  Airway size (mm): 7.0  Cormack-Lehane Classification: grade I - full view of glottis  Placement verified by: chest auscultation and capnometry   Marked at 20  Measured from: lips  Secured with: Tape  Number of attempts at approach: 1  Ventilation between attempts: none  Number of other approaches attempted: 0Airway complications: Atraumatic

## 2023-11-25 NOTE — Anesthesia Preprocedure Evaluation (Signed)
 ANESTHESIA PRE-OP EVALUATION  Planned Procedure: LAPAROSCOPIC BILATERAL SALPINGECTOMIES; HYSTEROSCOPY; DILATION AND CURETTAGE; MINERVA ABLATION (Bilateral: Abdomen)  N/A (Bilateral: Vagina )  Review of Systems     anesthesia history negative     patient summary reviewed  nursing notes reviewed        Pulmonary  negative pulmonary ROS,    Cardiovascular    Hypertension, well controlled, ECG reviewed and hyperlipidemia ,No peripheral edema,  Exercise Tolerance: > or = 4 METS        GI/Hepatic/Renal    motion sickness and kidney stones        Endo/Other    hypothyroidism,      Neuro/Psych/MS    back abnormality     Cancer    negative hematology/oncology ROS,               Physical Assessment      Airway       Mallampati: II    TM distance: >3 FB    Neck ROM: full  Mouth Opening: good.            Dental       Dentition intact             Pulmonary    Breath sounds clear to auscultation  (-) no rhonchi, no decreased breath sounds, no wheezes, no rales and no stridor     Cardiovascular    Rhythm: regular  Rate: Normal  (-) no friction rub, carotid bruit is not present, no peripheral edema and no murmur     Other findings          Plan  ASA 2     Planned anesthesia type: general     general anesthesia with endotracheal tube intubation      PONV Plan:  I plan to administer pharmcologic prophalaxis antiemetics                  Anesthesia issues/risks discussed are: Dental Injuries, Stroke, Aspiration, Cardiac Events/MI, Intraoperative Awareness/ Recall and Sore Throat.  Anesthetic plan and risks discussed with patient  signed consent obtained          Patient's NPO status is appropriate for Anesthesia.           Plan discussed with CRNA.

## 2023-11-25 NOTE — Anesthesia Postprocedure Evaluation (Signed)
 Anesthesia Post Op Evaluation    Patient: Jennifer Lindsey  Procedure(s):  LAPAROSCOPIC BILATERAL SALPINGECTOMIES; HYSTEROSCOPY; DILATION AND CURETTAGE; MINERVA ABLATION  N/A    Last Vitals:Temperature: 36.5 C (97.7 F) (11/25/23 0952)  Heart Rate: 88 (11/25/23 1020)  BP (Non-Invasive): 126/78 (11/25/23 1020)  Respiratory Rate: 12 (11/25/23 1020)  SpO2: 100 % (11/25/23 1025)    No notable events documented.      Patient location during evaluation: PACU       Patient participation: complete - patient participated  Level of consciousness: awake    Pain management: satisfactory to patient    Anesthetic complications: no  Cardiovascular status: acceptable  Respiratory status: acceptable  Hydration status: acceptable  Patient post-procedure temperature: Pt Normothermic   PONV Status: Absent

## 2023-11-25 NOTE — OR Surgeon (Signed)
 [place] [facility]    OPERATIVE REPORT    [facility]    Patient Name:  Jennifer Lindsey [facility] Number:  [id number]  Date of Service:  11/25/23  Date of birth:  05/07/1986  PREOPERATIVE DIAGNOSES:  UNDESIRED FERTILITY; MENORRHAGIA   POSTOPERATIVE DIAGNOSIS:  UNDESIRED FERTILITY; MENORRHAGIA    PROCEDURE:  LAPAROSCOPIC BILATERAL SALPINGECTOMIES; HYSTEROSCOPY; DILATION AND CURETTAGE; MINERVA ABLATION: 74259 (CPT)  N/A: 56387 (CPT)    SURGEON:  Jennifer Colt, MD   ANESTHESIA STAFF:  Anesthesiologist: Cornett, Larwance Poe, MD  CRNA: Smith Dunk, CRNA   ANESTHESIA TYPE:General   First assist: None   COMPLICATIONS:  None  ESTIMATED BLOOD LOSS:  Than 10 cc  FLUIDS:  Crystalloid.   FINDINGS:    Hysteroscopic findings should patient have a mildly enlarged and slightly irregular endometrial cavity.  There were no endometrial or endocervical lesion seen.  A D&C was performed and small amount of tissue was obtained.  All tissue was sent to pathology.  Endometrial ablation was performed and postablation hysteroscopic findings showed a good char throughout the entire endometrial cavity.  Laparoscopic findings showed the patient to have an enlarged uterus with uterine fibroids.  The uterus was distorted.  The left ovary is adherent to the posterior uterine wall and the uterus is somewhat rotated counter-clockwise.  The right tube and ovary appeared normal.  There was scarring noted from old endometriosis.  Both fallopian tubes were resected and sent to pathology.   TECHNIQUE:    The patient was taken to the operating room where general anesthesia was obtained without difficulty.  The patient was prepped and draped in the usual sterile fashion in the dorsal lithotomy position.  A weighted speculum was placed in the patient's vagina.  Anterior lip of cervix was grasped with a single-tooth tenaculum.  The patient's cervix was then sequentially dilated using Hagar dilators.   Hysteroscope was inserted in the patient's uterus and endometrial distention was obtained using saline.  Hysteroscopic findings were as noted above.  At this point, the hysteroscope was removed.  A small Banjo curette was then used to perform a thorough endometrial curettage, until a gritty texture was noted throughout the entire endometrial cavity.  Small amount of tissue was obtained.  All tissue sent to pathology.  At this point, the uterus sounded to 10 cm.  The Minerva endometrial ablation device was inserted in the patient's uterus and deployed in standard fashion.  The 1st hand piece gave a code indicating a defective hand piece.  I looked up in the uterus with the hysteroscope and confirmed there was no perforation.  A 2nd hand piece was obtained and the code once again flashed a defective hand piece.  A new generator was then obtained as well as a new hand piece.  I once again inserted the hysteroscope within the uterus and did not see any evidence of uterine perforation.  I then proceeded to the laparoscopic portion of the procedure which is dictated below.  Laparoscopic findings again confirmed no evidence of uterine perforation.  At the conclusion of the laparoscopic portion I obtained 1 last hand piece.  This time, the ablation was able to be performed and was successful.  A length of 6.5 cm was used to perform endometrial ablation for 120 seconds.  The Minerva device was then removed and hysteroscope reinserted.  Hysteroscopic findings showed a good char throughout the entire endometrial cavity including both cornual regions.  At this point, all instruments were  removed from the vagina.   Attention was then turned to the abdomen.  The skin of the umbilicus, suprapubic region and left lower quadrant was infiltrated with 0.25% Marcaine .  A small skin incision was then made at the base of umbilicus.  A Veress needle was inserted in the abdomen and intraabdominal placement was confirmed with saline drop  test.  Pneumoperitoneum was then obtained with approximately 3 liters of CO2 gas.  A 5 mm trocar and sleeve was advanced in the abdomen and intraabdominal placement confirmed with laparoscope.  A second skin incision was made approximately 2 cm above the symphysis pubis in the midline and an 8 mm trocar and sleeve was advanced under direct visualization.  A third skin incision was made in the patient's left lower quadrant.  A 5 mm trocar and sleeve was advanced under direct visualization.  Survey of the patient's pelvis and abdomen revealed the above findings.  As stated, I did not see any evidence of uterine perforation.  At this point, using the LigaSure, both fallopian tubes were resected and pulled up through the suprapubic port site.  Fallopian tubes were sent to Pathology.  The pelvis was irrigated and excellent hemostasis was maintained.  The remainder of the pelvis and abdomen appeared normal.  The Minerva endometrial ablation was then performed as described above.  At this point, all laparoscopic instruments were removed from the abdomen.  Pneumoperitoneum was expressed.  Skin incisions were closed in a subcuticular fashion using 4-0 Vicryl.  Steri-Strips were applied and bandages placed.  The manipulator was removed.  No bleeding from the attachment site.  The patient tolerated the procedure well.  Sponge, lap, needle, instrument counts were all correct.  The patient was taken to recovery room in stable condition.  Jennifer Colt, MD, Christin Coy

## 2023-11-25 NOTE — Nurses Notes (Signed)
 Bedside hand off given to: Archibald Beard  Vitals Signs as follows: Temp: 36.2c                                        HR: 97                                        Resp: 16                                        BP: 113/73                                        O2: 99 RA  Dressing Status: steri strips x2 both suprapubic and navel have scant blood present. Peripad present with small amt of blood. Carrie at bedside to observe. Patient has glasses with her.

## 2023-11-25 NOTE — Discharge Instructions (Signed)
 Follow up in office in two weeks. Call the office to obtain this appointment if you do not have one already scheduled.     Call the office with any questions or concerns that you may have.     Follow any instructions given to you by your doctor.     Call the office if you experience bleeding more than one pad per hour, temperature of 101 or greater, or have pain that is uncontrollable with pain medication.     Resume home medications as instructed.     Stop by your pharmacy and pick up your prescription.

## 2023-11-25 NOTE — Addendum Note (Signed)
 Addendum  created 11/25/23 1218 by Smith Dunk, CRNA    Flowsheet accepted

## 2023-11-25 NOTE — Anesthesia Transfer of Care (Signed)
 ANESTHESIA TRANSFER OF CARE   Jennifer Lindsey is a 38 y.o. ,female, Weight: 61.2 kg (135 lb)   had Procedure(s):  LAPAROSCOPIC BILATERAL SALPINGECTOMIES; HYSTEROSCOPY; DILATION AND CURETTAGE; MINERVA ABLATION  N/A  performed  11/25/23   Primary Service: Jerrlyn Morel Edwar*    Past Medical History:   Diagnosis Date   . Arthritis    . Back problem    . Chronic pain    . Clotting disorder (CMS HCC)     mtfhr   . Hyperlipidemia    . Hypothyroid    . Kidney stones    . Motion sickness    . Systemic lupus erythematosus (CMS HCC)    . Wears glasses       Allergy History as of 11/25/23       PENICILLINS         Noted Status Severity Type Reaction    12/18/08 Haddix, [place], MA 12/18/08 Active   Rash              ESTROGENS         Noted Status Severity Type Reaction    12/18/08 Delman Ferns, MA 12/18/08 Active High   Other Adverse Reaction (Add comment)    Comments: Really high BP                   I completed my transfer of care / handoff to the receiving personnel during which we discussed:  Access, Airway, All key/critical aspects of case discussed, Analgesia, Antibiotics, Expectation of post procedure, Fluids/Product, Gave opportunity for questions and acknowledgement of understanding, Labs and PMHx      Post Location: PACU                                                           Last OR Temp: Temperature: 36.6 C (97.8 F)  ABG:  POTASSIUM   Date Value Ref Range Status   03/18/2008 4.0 3.5 - 5.1 mmol/L Final     KETONES   Date Value Ref Range Status   05/02/2008 NEGATIVE NEGATIVE mg/dL Final     CALCIUM   Date Value Ref Range Status   03/18/2008 9.2 8.5 - 10.4 mg/dL Final     CASTS   Date Value Ref Range Status   11/15/2007 NONE SEEN 0 - 2.5 /LPF Final     Airway:* No LDAs found *  Blood pressure 119/67, pulse 67, temperature 36.6 C (97.8 F), resp. rate 18, height 1.651 m (5\' 5" ), weight 61.2 kg (135 lb), SpO2 99%.

## 2023-11-26 NOTE — H&P (Signed)
 [facility]      H&P UPDATE FORM                                                                                  Louana, [place], 38 y.o. female  Date of Admission:  11/25/2023  Date of Birth:  Jun 12, 1986    11/26/2023    STOP: IF H&P IS GREATER THAN 30 DAYS FROM SURGICAL DAY COMPLETE NEW H&P IS REQUIRED.     H & P updated the day of the procedure.  1.  H&P completed within 30 days of surgical procedure and has been reviewed within 24 hours of admission but prior to surgery or a procedure requiring anesthesia services, the patient has been examined, and no change has occured in the patients condition since the H&P was completed.       Change in medications: No        No LMP recorded.      Comments:     2.  Patient continues to be appropriate candidate for planned surgical procedure. YES    Chalice Colt, MD, Christin Coy

## 2023-11-26 NOTE — H&P (Signed)
 Written H&P is signed and on the chart

## 2023-11-28 DIAGNOSIS — N920 Excessive and frequent menstruation with regular cycle: Secondary | ICD-10-CM

## 2023-11-28 DIAGNOSIS — Z640 Problems related to unwanted pregnancy: Secondary | ICD-10-CM

## 2023-11-28 LAB — SURGICAL PATHOLOGY SPECIMEN

## 2023-12-26 ENCOUNTER — Emergency Department
Admission: EM | Admit: 2023-12-26 | Discharge: 2023-12-26 | Disposition: A | Discharge status: Home / Self Care | Attending: NURSE PRACTITIONER | Admitting: NURSE PRACTITIONER

## 2023-12-26 ENCOUNTER — Encounter (HOSPITAL_COMMUNITY): Payer: Self-pay

## 2023-12-26 ENCOUNTER — Other Ambulatory Visit: Payer: Self-pay

## 2023-12-26 DIAGNOSIS — B349 Viral infection, unspecified: Secondary | ICD-10-CM | POA: Insufficient documentation

## 2023-12-26 DIAGNOSIS — K2970 Gastritis, unspecified, without bleeding: Secondary | ICD-10-CM | POA: Insufficient documentation

## 2023-12-26 DIAGNOSIS — R112 Nausea with vomiting, unspecified: Secondary | ICD-10-CM | POA: Insufficient documentation

## 2023-12-26 DIAGNOSIS — Z1152 Encounter for screening for COVID-19: Secondary | ICD-10-CM | POA: Insufficient documentation

## 2023-12-26 LAB — MANUAL DIFFERENTIAL
BAND %: 4 % — ABNORMAL LOW (ref 5–11)
BANDS NEUTROPHILS MANUAL: 4
LYMPHOCYTE %: 2 % — ABNORMAL LOW (ref 25–45)
LYMPHOCYTE ABSOLUTE: 0.37 10*3/uL — ABNORMAL LOW (ref 1.10–5.00)
LYMPHOCYTES MANUAL: 2
MONOCYTE %: 1 % (ref 0–12)
MONOCYTE ABSOLUTE: 0.19 10*3/uL (ref 0.00–1.30)
MONOCYTES MANUAL: 1
NEUTROPHIL %: 93 % — ABNORMAL HIGH (ref 40–76)
NEUTROPHIL ABSOLUTE: 18.14 10*3/uL — ABNORMAL HIGH (ref 1.80–8.40)
NEUTROPHILS MANUAL: 93
PLATELET MORPHOLOGY COMMENT: NORMAL
TOTAL CELLS COUNTED [#] IN BLOOD: 100
WBC: 18.7 10*3/uL

## 2023-12-26 LAB — URINALYSIS, MACROSCOPIC
BILIRUBIN: NEGATIVE mg/dL
BLOOD: 0.03 mg/dL
GLUCOSE: NEGATIVE mg/dL
KETONES: 100 mg/dL — AB
LEUKOCYTES: NEGATIVE WBCs/uL
NITRITE: NEGATIVE
PH: 7.5 (ref 5.0–9.0)
PROTEIN: NEGATIVE mg/dL
SPECIFIC GRAVITY: 1.026 (ref 1.002–1.030)
UROBILINOGEN: NORMAL mg/dL

## 2023-12-26 LAB — COMPREHENSIVE METABOLIC PANEL, NON-FASTING
ALBUMIN/GLOBULIN RATIO: 1.7 — ABNORMAL HIGH (ref 0.8–1.4)
ALBUMIN: 4.7 g/dL (ref 3.5–5.7)
ALKALINE PHOSPHATASE: 43 U/L (ref 34–104)
ALT (SGPT): 11 U/L (ref 7–52)
ANION GAP: 14 mmol/L — ABNORMAL HIGH (ref 4–13)
AST (SGOT): 17 U/L (ref 13–39)
BILIRUBIN TOTAL: 1.1 mg/dL — ABNORMAL HIGH (ref 0.3–1.0)
BUN/CREA RATIO: 23 — ABNORMAL HIGH (ref 6–22)
BUN: 19 mg/dL (ref 7–25)
CALCIUM, CORRECTED: 8.7 mg/dL — ABNORMAL LOW (ref 8.9–10.8)
CALCIUM: 9.3 mg/dL (ref 8.6–10.3)
CHLORIDE: 105 mmol/L (ref 98–107)
CO2 TOTAL: 18 mmol/L — ABNORMAL LOW (ref 21–31)
CREATININE: 0.83 mg/dL (ref 0.60–1.30)
ESTIMATED GFR: 91 mL/min/{1.73_m2} (ref >59)
GLOBULIN: 2.7 (ref 2.0–3.5)
GLUCOSE: 175 mg/dL — ABNORMAL HIGH (ref 74–109)
OSMOLALITY, CALCULATED: 280 mosm/kg (ref 270–290)
POTASSIUM: 4.2 mmol/L (ref 3.5–5.1)
PROTEIN TOTAL: 7.4 g/dL (ref 6.4–8.9)
SODIUM: 137 mmol/L (ref 136–145)

## 2023-12-26 LAB — URINALYSIS, MICROSCOPIC
RBCS: 11 /HPF — ABNORMAL HIGH (ref <4)
SQUAMOUS EPITHELIAL: 1 /HPF (ref <28)
WBCS: <1 /HPF (ref <6)

## 2023-12-26 LAB — CBC WITH DIFF
HCT: 43.7 % — ABNORMAL HIGH (ref 31.2–41.9)
HGB: 14.8 g/dL — ABNORMAL HIGH (ref 10.9–14.3)
MCH: 30.1 pg (ref 24.7–32.8)
MCHC: 33.9 g/dL (ref 32.3–35.6)
MCV: 88.7 fL (ref 75.5–95.3)
MPV: 8.7 fL (ref 7.9–10.8)
PLATELETS: 231 10*3/uL (ref 140–440)
RBC: 4.92 10*6/uL (ref 3.63–4.92)
RDW: 12.9 % (ref 12.3–17.7)
WBC: 18.7 10*3/uL — ABNORMAL HIGH (ref 3.8–11.8)

## 2023-12-26 LAB — RAPID THROAT SCREEN, STREPTOCOCCUS, WITH REFLEX: THROAT RAPID SCREEN, STREPTOCOCCUS: NEGATIVE

## 2023-12-26 LAB — DRUG SCREEN, NO CONFIRMATION, URINE
AMPHETAMINES URINE: NEGATIVE
BARBITURATES URINE: NEGATIVE
BENZODIAZEPINES URINE: NEGATIVE
BUPRENORPHINE URINE: NEGATIVE
CANNABINOIDS URINE: NEGATIVE
COCAINE METABOLITES URINE: NEGATIVE
FENTANYL, URINE: NEGATIVE
METHADONE URINE: NEGATIVE
OPIATES URINE: NEGATIVE
OXYCODONE URINE: NEGATIVE
PCP URINE: NEGATIVE

## 2023-12-26 LAB — COVID-19, FLU A/B, RSV RAPID BY PCR
INFLUENZA VIRUS TYPE A: NOT DETECTED
INFLUENZA VIRUS TYPE B: NOT DETECTED
RESPIRATORY SYNCTIAL VIRUS (RSV): NOT DETECTED
SARS-CoV-2: NOT DETECTED

## 2023-12-26 LAB — MAGNESIUM: MAGNESIUM: 1.5 mg/dL — ABNORMAL LOW (ref 1.9–2.7)

## 2023-12-26 LAB — LIPASE: LIPASE: 5 U/L — ABNORMAL LOW (ref 11–82)

## 2023-12-26 MED ORDER — MAGNESIUM SULFATE 1 GRAM/100 ML IN DEXTROSE 5 % INTRAVENOUS PIGGYBACK
1.0000 g | INJECTION | INTRAVENOUS | Status: AC
Start: 2023-12-26 — End: 2023-12-26
  Administered 2023-12-26: 0 g via INTRAVENOUS
  Administered 2023-12-26 (×2): 1 g via INTRAVENOUS
  Administered 2023-12-26: 0 g via INTRAVENOUS

## 2023-12-26 MED ORDER — SODIUM CHLORIDE 0.9 % IV BOLUS
1000.0000 mL | INJECTION | Status: AC
Start: 2023-12-26 — End: 2023-12-26
  Administered 2023-12-26: 0 mL via INTRAVENOUS
  Administered 2023-12-26: 1000 mL via INTRAVENOUS

## 2023-12-26 MED ORDER — PANTOPRAZOLE 40 MG INTRAVENOUS SOLUTION
40.0000 mg | INTRAVENOUS | Status: AC
Start: 2023-12-26 — End: 2023-12-26
  Administered 2023-12-26: 40 mg via INTRAVENOUS

## 2023-12-26 MED ORDER — MAGNESIUM SULFATE 1 GRAM/100 ML IN DEXTROSE 5 % INTRAVENOUS PIGGYBACK
INJECTION | INTRAVENOUS | Status: AC
Start: 2023-12-26 — End: 2023-12-26
  Filled 2023-12-26: qty 100

## 2023-12-26 MED ORDER — ONDANSETRON HCL (PF) 4 MG/2 ML INJECTION SOLUTION
INTRAMUSCULAR | Status: AC
Start: 2023-12-26 — End: 2023-12-26
  Filled 2023-12-26: qty 2

## 2023-12-26 MED ORDER — ONDANSETRON HCL (PF) 4 MG/2 ML INJECTION SOLUTION
4.0000 mg | INTRAMUSCULAR | Status: AC
Start: 2023-12-26 — End: 2023-12-26
  Administered 2023-12-26: 4 mg via INTRAVENOUS

## 2023-12-26 MED ORDER — PANTOPRAZOLE 40 MG INTRAVENOUS SOLUTION
INTRAVENOUS | Status: AC
Start: 2023-12-26 — End: 2023-12-26
  Filled 2023-12-26: qty 10

## 2023-12-26 MED ORDER — PROCHLORPERAZINE EDISYLATE 10 MG/2 ML (5 MG/ML) INJECTION SOLUTION
INTRAMUSCULAR | Status: AC
Start: 2023-12-26 — End: 2023-12-26
  Filled 2023-12-26: qty 2

## 2023-12-26 MED ORDER — HALOPERIDOL LACTATE 5 MG/ML INJECTION SOLUTION
INTRAMUSCULAR | Status: AC
Start: 2023-12-26 — End: 2023-12-26
  Filled 2023-12-26: qty 1

## 2023-12-26 MED ORDER — SODIUM CHLORIDE 0.9 % IV BOLUS
1000.0000 mL | INJECTION | Status: AC
Start: 2023-12-26 — End: 2023-12-26
  Administered 2023-12-26: 1000 mL via INTRAVENOUS
  Administered 2023-12-26 (×2): 0 mL via INTRAVENOUS

## 2023-12-26 MED ORDER — HALOPERIDOL LACTATE 5 MG/ML INJECTION SOLUTION
2.0000 mg | INTRAMUSCULAR | Status: AC
Start: 2023-12-26 — End: 2023-12-26
  Administered 2023-12-26: 2 mg via INTRAVENOUS

## 2023-12-26 MED ORDER — PROCHLORPERAZINE EDISYLATE 10 MG/2 ML (5 MG/ML) INJECTION SOLUTION
10.0000 mg | INTRAMUSCULAR | Status: DC
Start: 2023-12-26 — End: 2023-12-26
  Administered 2023-12-26: 0 mg via INTRAVENOUS

## 2023-12-26 MED ORDER — PROCHLORPERAZINE EDISYLATE 10 MG/2 ML (5 MG/ML) INJECTION SOLUTION
10.0000 mg | INTRAMUSCULAR | Status: AC
Start: 2023-12-26 — End: 2023-12-26
  Administered 2023-12-26: 10 mg via INTRAVENOUS

## 2023-12-26 NOTE — ED Nurses Note (Signed)
 Patient discharged home with family. AVS reviewed with patient. A written copy of the AVS and discharge instructions were given to the patient. Questions sufficiently answered as needed. Patient encouraged to follow up with PCP as indicated. In the event of an emergency, patient instructed to call 911 or go to the nearest emergency room. Patient left department via ambulation.

## 2023-12-26 NOTE — ED Nurses Note (Signed)
 Patient received to room 8 with complaints of vomiting and abdominal pain. Pt reports that she began experiencing abdominal pain with vomiting at 1930 yesterday. Pt waited to come in as it was tolerable at the beginning. Pain has increased and pt has had increased vomiting since them. Pt in room moving in bed and not able to sit still. Pt has vomited multiple times this AM and has been dry heaving over side of bed following Zofran  administration. Positive bowel sounds throughout abdomen. Abdomen is tender to palpation throughout. Patient placed on monitor, VSS, assessment and history completed. Call light in hand, encouraged to call for any needs. Awaiting providers orders at this time.

## 2023-12-26 NOTE — ED Nurses Note (Signed)
 Pt still vomiting at this time. Provider ordered compazine . Pt medicated with compazine  at this time.

## 2023-12-26 NOTE — Discharge Instructions (Addendum)

## 2023-12-26 NOTE — ED Nurses Note (Signed)
 Pt medicated with haldol  at this time.

## 2023-12-26 NOTE — ED Nurses Note (Signed)
 In to check on pt. Pt states the nausea is coming back and she feels like she is going to throw up. Provider notified.

## 2023-12-26 NOTE — ED Nurses Note (Signed)
 Pt medicated with fluids, Protonix , and Zofran  at this time. Awaiting further orders.

## 2023-12-26 NOTE — ED Triage Notes (Signed)
 Nausea, vomiting since 1930, abdominal pain, dizziness.

## 2023-12-26 NOTE — ED Provider Notes (Cosign Needed)
 [facility]  ED Primary Provider Note  Patient Name: Jennifer Lindsey  Patient Age: 38 y.o.  Date of Birth: [birth year]    Chief Complaint: Vomiting and Abdominal Pain        History of Present Illness       Jennifer Lindsey is a 38 y.o. female who had concerns including Vomiting and Abdominal Pain.  Patient presents to ED today for vomiting and abdominal pain.  Patient states it started around 1930 last night.  Patient does report some dizziness.  Patient states that her daughter did recently have the same symptoms.  Patient is initially hunched over vomiting in the trash can.  Denies any fever chills shortness of breath or chest pain.        Review of Systems     No other overt Review of Systems are noted to be positive except noted in the HPI.      Historical Data   History Reviewed This Encounter:        Physical Exam   ED Triage Vitals [12/26/23 0549]   BP (Non-Invasive) (!) 167/106   Heart Rate (!) 102   Respiratory Rate 20   Temperature 36.8 C (98.2 F)   SpO2 98 %   Weight 59 kg (130 lb)   Height 1.651 m (5\' 5" )         Nursing notes reviewed for what could be assessed. Past Medical, Surgical, and Social history reviewed for what has been completed.     Constitutional: NAD. Well-Developed. Well Nourished.  Head: Normocephalic, atraumatic.  Mouth/Throat:  Moist mucous membranes.  Eyes: EOM grossly intact, conjunctiva normal.  Neck: Supple  Cardiovascular: Regular Rate and Rhythm, extremities well perfused.  Pulmonary/Chest: No respiratory distress. Lungs are symmetric to auscultation bilaterally.    Abdominal: Soft, non-tender, non-distended. Non peritoneal, no rebound, no guarding.  MSK: No Lower Extremity Edema.  Skin: Warm, dry, and intact  Neuro: Appropriate, CN II-XII grossly intact. Gait at patient's baseline  Psych: Pleasant             Procedures      Patient Data     Labs Ordered/Reviewed   COMPREHENSIVE METABOLIC PANEL, NON-FASTING - Abnormal; Notable for the following  components:       Result Value    CO2 TOTAL 18 (*)     ANION GAP 14 (*)     BUN/CREA RATIO 23 (*)     GLUCOSE 175 (*)     BILIRUBIN TOTAL 1.1 (*)     ALBUMIN/GLOBULIN RATIO 1.7 (*)     CALCIUM, CORRECTED 8.7 (*)     All other components within normal limits    Narrative:     Estimated Glomerular Filtration Rate (eGFR) is calculated using the CKD-EPI (2021) equation, intended for patients 36 years of age and older. If gender is not documented or "unknown", there will be no eGFR calculation.     CBC WITH DIFF - Abnormal; Notable for the following components:    WBC 18.7 (*)     HGB 14.8 (*)     HCT 43.7 (*)     All other components within normal limits   URINALYSIS, MACROSCOPIC - Abnormal; Notable for the following components:    KETONES 100 (*)     All other components within normal limits   URINALYSIS, MICROSCOPIC - Abnormal; Notable for the following components:    BUDDING YEAST Rare (*)     RBCS 11 (*)  All other components within normal limits   MANUAL DIFFERENTIAL - Abnormal; Notable for the following components:    NEUTROPHIL % 93 (*)     LYMPHOCYTE % 2 (*)     BAND % 4 (*)     NEUTROPHIL ABSOLUTE 18.14 (*)     LYMPHOCYTE ABSOLUTE 0.37 (*)     All other components within normal limits   MAGNESIUM  - Abnormal; Notable for the following components:    MAGNESIUM  1.5 (*)     All other components within normal limits   LIPASE - Abnormal; Notable for the following components:    LIPASE 5 (*)     All other components within normal limits   RAPID THROAT SCREEN, STREPTOCOCCUS, WITH REFLEX - Normal    Narrative:     Walk-Away Mode   COVID-19, FLU A/B, RSV RAPID BY PCR - Normal    Narrative:     Results are for the simultaneous qualitative identification of SARS-CoV-2 (formerly 2019-nCoV), Influenza A, Influenza B, and RSV RNA. These etiologic agents are generally detectable in nasopharyngeal and nasal swabs during the ACUTE PHASE of infection. Hence, this test is intended to be performed on respiratory specimens  collected from individuals with signs and symptoms of upper respiratory tract infection who meet Centers for Disease Control and Prevention (CDC) clinical and/or epidemiological criteria for Coronavirus Disease 2019 (COVID-19) testing. CDC COVID-19 criteria for testing on human specimens is available at [facility] webpage information for Healthcare Professionals: Coronavirus Disease 2019 (COVID-19) ([employer]).     False-negative results may occur if the virus has genomic mutations, insertions, deletions, or rearrangements or if performed very early in the course of illness. Otherwise, negative results indicate virus specific RNA targets are not detected, however negative results do not preclude SARS-CoV-2 infection/COVID-19, Influenza, or Respiratory syncytial virus infection. Results should not be used as the sole basis for patient management decisions. Negative results must be combined with clinical observations, patient history, and epidemiological information. If upper respiratory tract infection is still suspected based on exposure history together with other clinical findings, re-testing should be considered.    Test methodology:   Cepheid Xpert Xpress SARS-CoV-2/Flu/RSV Assay real-time polymerase chain reaction (RT-PCR) test on the GeneXpert Dx and Xpert Xpress systems.   THROAT CULTURE, BETA HEMOLYTIC STREPTOCOCCUS   CBC/DIFF    Narrative:     The following orders were created for panel order CBC/DIFF.  Procedure                               Abnormality         Status                     ---------                               -----------         ------                     CBC WITH DIFF[[id number]]                Abnormal            Final result               MANUAL DIFFERENTIAL[[id number]]          Abnormal  Final result                 Please view results for these tests on the individual orders.   URINALYSIS, MACROSCOPIC AND MICROSCOPIC W/CULTURE REFLEX     Narrative:     The following orders were created for panel order URINALYSIS, MACROSCOPIC AND MICROSCOPIC W/CULTURE REFLEX [PRN ONLY].  Procedure                               Abnormality         Status                     ---------                               -----------         ------                     URINALYSIS, MACROSCOPIC[[id number]]      Abnormal            Final result               URINALYSIS, MICROSCOPIC[[id number]]      Abnormal            Final result                 Please view results for these tests on the individual orders.   DRUG SCREEN, NO CONFIRMATION, URINE       No orders to display       Medical Decision Making        Medical Decision Making  Amount and/or Complexity of Data Reviewed  Labs: ordered.  ECG/medicine tests: independent interpretation performed.    Risk  Prescription drug management.          Studies Assessed:  Labs        MDM Narrative:  Patient presents to ED today for vomiting and abdominal pain.  Patient states it started around 1930 last night.  Patient does report some dizziness.  Patient states that her daughter did recently have the same symptoms.  Patient is initially hunched over vomiting in the trash can.  Denies any fever chills shortness of breath or chest pain.  Patient's white count was 18.7.  I do feel that this is a shift from her actively vomiting and due to the gastritis that I feel that she has.  Magnesium  was 1.5 this was replenished.  Patient did receive 2 L of fluid.  Patient states she feels significantly better.  The dizziness has subsided.  Blood pressure is within normal limits.  I did discuss the CT with the patient and she states that she did not feel she needs 1 today.  Prior to discharge patient is able to ambulate to the restroom without difficulty.  Patient does have a driver.  Instructed patient that she likely has gastritis I did continue to increase your intake of water Gatorade Pedialyte as tolerated.  Patient has a friend at home.  Patient  was discharged.      ED Course as of 12/26/23 1259   Mon Dec 26, 2023   0627 WBC(!): 18.7   0628 HGB(!): 14.8   0628 HCT(!): 43.7   0628 THROAT RAPID SCREEN, STREPTOCOCCUS: Negative   0955 MAGNESIUM (!): 1.5  Magnesium  ordered   1129 Upon reassessment patient  states she feels significantly better.  Patient has not vomited since she received the antiemetics in our emergency department today.  Patient states she is eager to get home.  Patient does have a driver.         Medications Ordered/Administered in the ED   prochlorperazine  (COMPAZINE ) 5 mg/mL injection (0 mg Intravenous Not Given 12/26/23 1155)   NS bolus infusion 1,000 mL (0 mL Intravenous Stopped 12/26/23 0916)   ondansetron  (ZOFRAN ) 2 mg/mL injection (4 mg Intravenous Given 12/26/23 0701)   pantoprazole  (PROTONIX ) 4 mg/mL injection (40 mg Intravenous Given 12/26/23 0706)   prochlorperazine  (COMPAZINE ) 5 mg/mL injection (10 mg Intravenous Given 12/26/23 0730)   NS bolus infusion 1,000 mL (0 mL Intravenous Stopped 12/26/23 1020)   haloperidol  (HALDOL ) 5 mg/mL injection (2 mg Intravenous Given 12/26/23 0921)   magnesium  sulfate 1 G in D5W 100 mL premix IVPB (0 g Intravenous Stopped 12/26/23 1107)       Following the history, physical exam, and ED workup, the patient was deemed stable and suitable for discharge. The patient/caregiver was advised to return to the ED for any new or worsening symptoms. Discharge medications, and follow-up instructions were discussed with the patient/caregiver in detail, who verbalizes understanding. The patient/caregiver is in agreement and is comfortable with the plan of care.    Disposition: Discharged         Current Discharge Medication List        CONTINUE these medications - NO CHANGES were made during your visit.        Details   busPIRone  10 mg Tablet  Commonly known as: BUSPAR    Take one tablet by mouth daily  Qty: 90 Tablet  Refills: 0     citalopram  40 mg Tablet  Commonly known as: CeleXA    Take one tablet by mouth daily  Qty:  90 Tablet  Refills: 0     cyanocobalamin  1,000 mcg/mL Solution  Commonly known as: VITAMIN B12   Inject 1mL IM once a month  Qty: 3 mL  Refills: 2     hydroxychloroquine 200 mg Tablet  Commonly known as: PLAQUENIL   200 mg, Daily  Refills: 0     Ibuprofen  800 mg Tablet  Commonly known as: MOTRIN    800 mg, Oral, 3 TIMES DAILY PRN  Qty: 90 Tablet  Refills: 5     levothyroxine  112 mcg Tablet  Commonly known as: SYNTHROID   112 mcg, EVERY MORNING  Refills: 0     liothyronine  5 mcg Tablet  Commonly known as: CYTOMEL    10 mcg, Oral, Daily  Qty: 180 Tablet  Refills: 0     losartan 25 mg Tablet  Commonly known as: COZAAR   Take 1 Tablet (25 mg total) by mouth Daily  Refills: 0     minoxidiL 2.5 mg Tablet  Commonly known as: LONITEN   Take 1 Tablet (2.5 mg total) by mouth Daily  Refills: 0     oxyCODONE -acetaminophen  7.5-325 mg Tablet  Commonly known as: PERCOCET   1 Tablet, Oral, EVERY 6 HOURS PRN  Qty: 28 Tablet  Refills: 0     Slynd 4 mg (28) Tablet  Generic drug: drospirenone (contraceptive)   4 mg, Daily  Refills: 0     traMADoL 50 mg Tablet  Commonly known as: ULTRAM   50 mg, EVERY 6 HOURS PRN  Refills: 0     Tretinoin  0.05 % Cream  Commonly known as: RETIN-A    Apply pea size amount to face nightly  as tolerated  Qty: 40 g  Refills: 4            ASK your doctor about these medications.        Details   ondansetron  8 mg Tablet, Rapid Dissolve  Commonly known as: ZOFRAN  ODT  Ask about: Which instructions should I use?   8 mg, Sublingual, EVERY 8 HOURS PRN  Qty: 15 Tablet  Refills: 5            Follow up:   Clifm Dame, DO  [address]  [place] [address]  [phone]                     Clinical Impression   Gastritis, presence of bleeding unspecified, unspecified chronicity, unspecified gastritis type (Primary)   Viral illness   Nausea and vomiting, unspecified vomiting type         Current Discharge Medication List              Harley Lies FNP-C  Department of Emergency Medicine

## 2023-12-26 NOTE — ED Nurses Note (Signed)
 Pt resting in bed at this time. No longer vomiting at this time.

## 2023-12-27 ENCOUNTER — Telehealth (HOSPITAL_COMMUNITY): Payer: Self-pay | Admitting: FAMILY PRACTICE

## 2023-12-27 NOTE — Telephone Encounter (Signed)
 Post Ed Follow-Up    Post ED Follow-Up:   Document completed and/or attempted interactive contact(s) after transition to home after emergency department stay.:   Transition Facility and relevant Date:   Discharge Date: 12/26/23  Discharge from [facility] Emergency Department?: Yes  Discharge Facility: [facility]  Contacted by: Othella Bliss, RN  Contact method: Patient/Caregiver Telephone  Contact completed: 12/27/2023  2:57 PM  MyChart message sent?: No  Was the AVS reviewed with patient?: Yes  How is the patient recovering?: Improving  Medications prescribed: No  Interventions: No needs identified

## 2023-12-28 LAB — THROAT CULTURE, BETA HEMOLYTIC STREPTOCOCCUS: THROAT CULTURE: NORMAL

## 2024-02-09 ENCOUNTER — Ambulatory Visit (INDEPENDENT_AMBULATORY_CARE_PROVIDER_SITE_OTHER): Payer: Self-pay | Admitting: Surgery

## 2024-02-09 ENCOUNTER — Encounter (INDEPENDENT_AMBULATORY_CARE_PROVIDER_SITE_OTHER): Payer: Self-pay | Admitting: Surgery

## 2024-02-09 ENCOUNTER — Other Ambulatory Visit: Payer: Self-pay

## 2024-02-09 VITALS — BP 115/70 | HR 69 | Temp 98.1°F | Resp 18 | Ht 65.0 in | Wt 130.0 lb

## 2024-02-09 DIAGNOSIS — K649 Unspecified hemorrhoids: Secondary | ICD-10-CM

## 2024-02-10 ENCOUNTER — Encounter (INDEPENDENT_AMBULATORY_CARE_PROVIDER_SITE_OTHER): Payer: Self-pay | Admitting: Surgery

## 2024-02-10 NOTE — Progress Notes (Signed)
 GENERAL SURGERY, [facility]  [address]  [place] [address]    Progress Note    Name: Jennifer Lindsey MRN:  [id number]   Date: 02/09/2024 DOB:  [birth year] (38 y.o.)                Date of Service:  02/10/2024  Jennifer Lindsey, 38 y.o. female  Date of Birth:  1985/12/03  PCP: Clifm Dame, DO  Referring:  Clifm Dame     HPI:  Jennifer Lindsey is a 38 y.o. White female who returns for follow up evaluation for hemorrhoid recheck. She states that she has intermittent problems with hemorrhoidal swelling and occasional bleeding.  They usually managed with local care.        Past Medical History:   Diagnosis Date    Arthritis     Back problem     Chronic pain     Clotting disorder (CMS HCC)     mtfhr    Hyperlipidemia     Hypothyroid     Kidney stones     Motion sickness     Systemic lupus erythematosus     Wears glasses       Past Surgical History:   Procedure Laterality Date    CESAREAN SECTION      HX PELVIC LAPAROSCOPY      LAPAROSCOPIC CHOLECYSTECTOMY        No outpatient medications have been marked as taking for the 02/09/24 encounter (Office Visit) with Jaquita Bessire B, MD.      Allergies   Allergen Reactions    Estrogens  Other Adverse Reaction (Add comment)     Really high BP    Penicillins Rash           BP 115/70   Pulse 69   Temp 36.7 C (98.1 F)   Resp 18   Ht 1.651 m (5\' 5" )   Wt 59 kg (130 lb)   SpO2 93%   BMI 21.63 kg/m          General: appropriate for age. in no acute distress.    Vital signs are present above and have been reviewed by me     HEENT: Atraumatic, Normocephalic.    Lungs: Nonlabored breathing with symmetric expansion    Heart:Regular wth respect to rate and rythmn.    Abdomen:Soft. Nontender. Nondistended and benign    Rectal exam shows some external hemorrhoidal skin but no swelling, thrombosis, prolapse or blood    Psychiatric: Alert and oriented to person, place, and time. affect appropriate       Assessment/Plan:  Assessment/Plan   1. Hemorrhoids,  unspecified hemorrhoid type         Continue local care as instructed before  Use suppositories for occasional swelling, irritation or bleeding  Use witch hazel soaked cotton balls applied to hemorrhoids for relief  Apply KY jelly to anal opening prior to each BM  RTC prn    No follow-ups on file.     This note was partially created using voice recognition software and is inherently subject to errors including those of syntax and "sound alike " substitutions which may escape proof reading. In such instances, original meaning may be extrapolated by contextual derivation.    Miquel Stacks B Alizae Bechtel, MD,MBA,FACS

## 2024-05-31 ENCOUNTER — Other Ambulatory Visit (INDEPENDENT_AMBULATORY_CARE_PROVIDER_SITE_OTHER): Payer: Self-pay | Admitting: Surgery

## 2024-05-31 DIAGNOSIS — Z01818 Encounter for other preprocedural examination: Secondary | ICD-10-CM

## 2024-05-31 DIAGNOSIS — K649 Unspecified hemorrhoids: Secondary | ICD-10-CM

## 2024-06-19 ENCOUNTER — Ambulatory Visit: Attending: Emergency Medicine

## 2024-06-19 ENCOUNTER — Other Ambulatory Visit: Payer: Self-pay

## 2024-06-19 DIAGNOSIS — Z021 Encounter for pre-employment examination: Secondary | ICD-10-CM | POA: Insufficient documentation

## 2024-06-19 LAB — URINALYSIS, MACROSCOPIC
BILIRUBIN: NEGATIVE mg/dL
BLOOD: NEGATIVE mg/dL
GLUCOSE: NEGATIVE mg/dL
KETONES: NEGATIVE mg/dL
LEUKOCYTES: NEGATIVE WBCs/uL
NITRITE: NEGATIVE
PH: 5.5 (ref 5.0–9.0)
PROTEIN: NEGATIVE mg/dL
SPECIFIC GRAVITY: 1.024 (ref 1.002–1.030)
UROBILINOGEN: NORMAL mg/dL

## 2024-06-19 LAB — COMPREHENSIVE METABOLIC PANEL, NON-FASTING
ALBUMIN/GLOBULIN RATIO: 1.6 — ABNORMAL HIGH (ref 0.8–1.4)
ALBUMIN: 4.3 g/dL (ref 3.5–5.7)
ALKALINE PHOSPHATASE: 40 U/L (ref 34–104)
ALT (SGPT): 13 U/L (ref 7–52)
ANION GAP: 5 mmol/L (ref 4–13)
AST (SGOT): 15 U/L (ref 13–39)
BILIRUBIN TOTAL: 0.6 mg/dL (ref 0.3–1.0)
BUN/CREA RATIO: 15 (ref 6–22)
BUN: 11 mg/dL (ref 7–25)
CALCIUM, CORRECTED: 9.1 mg/dL (ref 8.9–10.8)
CALCIUM: 9.3 mg/dL (ref 8.6–10.3)
CHLORIDE: 103 mmol/L (ref 98–107)
CO2 TOTAL: 29 mmol/L (ref 21–31)
CREATININE: 0.71 mg/dL (ref 0.60–1.30)
ESTIMATED GFR: 110 mL/min/1.73mˆ2 (ref >59)
GLOBULIN: 2.7 (ref 2.0–3.5)
GLUCOSE: 79 mg/dL (ref 74–109)
OSMOLALITY, CALCULATED: 272 mosm/kg (ref 270–290)
POTASSIUM: 4.3 mmol/L (ref 3.5–5.1)
PROTEIN TOTAL: 7 g/dL (ref 6.4–8.9)
SODIUM: 137 mmol/L (ref 136–145)

## 2024-06-19 LAB — CBC WITH DIFF
BASOPHIL #: 0 x10ˆ3/uL (ref 0.00–0.10)
BASOPHIL %: 1 % (ref 0–1)
EOSINOPHIL #: 0.1 x10ˆ3/uL (ref 0.00–0.50)
EOSINOPHIL %: 2 % (ref 1–7)
HCT: 41.9 % (ref 31.2–41.9)
HGB: 14.5 g/dL — ABNORMAL HIGH (ref 10.9–14.3)
LYMPHOCYTE #: 1.3 x10ˆ3/uL (ref 1.10–3.10)
LYMPHOCYTE %: 26 % (ref 16–46)
MCH: 30.2 pg (ref 24.7–32.8)
MCHC: 34.7 g/dL (ref 32.3–35.6)
MCV: 86.9 fL (ref 75.5–95.3)
MONOCYTE #: 0.4 x10ˆ3/uL (ref 0.20–0.90)
MONOCYTE %: 9 % (ref 4–11)
MPV: 8.3 fL (ref 7.9–10.8)
NEUTROPHIL #: 3 x10ˆ3/uL (ref 1.90–8.20)
NEUTROPHIL %: 62 % (ref 43–77)
PLATELETS: 225 x10ˆ3/uL (ref 140–440)
RBC: 4.82 x10ˆ6/uL (ref 3.63–4.92)
RDW: 13.2 % (ref 12.3–17.7)
WBC: 4.9 x10ˆ3/uL (ref 3.8–11.8)

## 2024-06-19 LAB — URINALYSIS, MICROSCOPIC
BACTERIA: NEGATIVE /HPF
RBCS: 2 /HPF (ref <4)
SQUAMOUS EPITHELIAL: 3 /HPF (ref <28)
WBCS: <1 /HPF (ref <6)

## 2024-06-19 LAB — BILIRUBIN, CONJUGATED (DIRECT): BILIRUBIN DIRECT: 0.08 md/dL (ref 0.03–0.18)

## 2024-06-22 ENCOUNTER — Ambulatory Visit
Admission: RE | Admit: 2024-06-22 | Discharge: 2024-06-22 | Disposition: A | Source: Ambulatory Visit | Attending: Surgery

## 2024-06-22 ENCOUNTER — Other Ambulatory Visit: Payer: Self-pay

## 2024-06-22 ENCOUNTER — Encounter (HOSPITAL_COMMUNITY): Payer: Self-pay | Admitting: Surgery

## 2024-06-22 DIAGNOSIS — K649 Unspecified hemorrhoids: Secondary | ICD-10-CM | POA: Insufficient documentation

## 2024-06-22 DIAGNOSIS — Z01818 Encounter for other preprocedural examination: Secondary | ICD-10-CM | POA: Insufficient documentation

## 2024-06-23 DIAGNOSIS — I491 Atrial premature depolarization: Secondary | ICD-10-CM

## 2024-06-23 DIAGNOSIS — Z01810 Encounter for preprocedural cardiovascular examination: Secondary | ICD-10-CM

## 2024-06-23 LAB — ECG 12 LEAD
Atrial Rate: 61 {beats}/min
Calculated P Axis: 55 degrees
Calculated R Axis: 89 degrees
Calculated T Axis: 50 degrees
PR Interval: 140 ms
QRS Duration: 104 ms
QT Interval: 450 ms
QTC Calculation: 453 ms
Ventricular rate: 61 {beats}/min

## 2024-06-25 ENCOUNTER — Encounter (HOSPITAL_COMMUNITY): Admission: RE | Disposition: A | Payer: Self-pay | Source: Ambulatory Visit | Attending: Surgery

## 2024-06-25 ENCOUNTER — Ambulatory Visit (HOSPITAL_COMMUNITY): Admitting: Surgery

## 2024-06-25 ENCOUNTER — Ambulatory Visit (HOSPITAL_COMMUNITY): Admitting: Anesthesiology

## 2024-06-25 ENCOUNTER — Ambulatory Visit
Admission: RE | Admit: 2024-06-25 | Discharge: 2024-06-25 | Disposition: A | Source: Ambulatory Visit | Attending: Surgery | Admitting: Surgery

## 2024-06-25 ENCOUNTER — Other Ambulatory Visit: Payer: Self-pay

## 2024-06-25 ENCOUNTER — Encounter (HOSPITAL_COMMUNITY): Payer: Self-pay | Admitting: Surgery

## 2024-06-25 DIAGNOSIS — K649 Unspecified hemorrhoids: Secondary | ICD-10-CM | POA: Insufficient documentation

## 2024-06-25 HISTORY — DX: Anxiety disorder, unspecified: F41.9

## 2024-06-25 HISTORY — DX: Essential (primary) hypertension: I10

## 2024-06-25 LAB — HCG, URINE QUALITATIVE, PREGNANCY: HCG URINE QUALITATIVE: NEGATIVE

## 2024-06-25 SURGERY — HEMORRHOIDECTOMY
Anesthesia: General | Site: Anus | Wound class: Clean Contaminated Wounds-The respiratory, GI, Genital, or urinary

## 2024-06-25 MED ORDER — MIDAZOLAM 5 MG/ML INJECTION WRAPPER
Freq: Once | INTRAMUSCULAR | Status: DC | PRN
Start: 2024-06-25 — End: 2024-06-25
  Administered 2024-06-25 (×2): 2.5 mg via INTRAVENOUS

## 2024-06-25 MED ORDER — ROPIVACAINE (PF) 2 MG/ML (0.2 %) INJECTION SOLUTION
INTRAMUSCULAR | Status: AC
Start: 2024-06-25 — End: 2024-06-25
  Filled 2024-06-25: qty 20

## 2024-06-25 MED ORDER — DEXAMETHASONE SODIUM PHOSPHATE 4 MG/ML INJECTION SOLUTION
4.0000 mg | Freq: Once | INTRAMUSCULAR | Status: AC
Start: 2024-06-25 — End: 2024-06-25
  Administered 2024-06-25: 4 mg via INTRAVENOUS

## 2024-06-25 MED ORDER — ONDANSETRON HCL (PF) 4 MG/2 ML INJECTION SOLUTION
4.0000 mg | Freq: Four times a day (QID) | INTRAMUSCULAR | Status: DC | PRN
Start: 2024-06-25 — End: 2024-06-25

## 2024-06-25 MED ORDER — LIDOCAINE (PF) 100 MG/5 ML (2 %) INTRAVENOUS SYRINGE
INJECTION | Freq: Once | INTRAVENOUS | Status: DC | PRN
Start: 2024-06-25 — End: 2024-06-25
  Administered 2024-06-25: 80 mg via INTRAVENOUS

## 2024-06-25 MED ORDER — ONDANSETRON HCL (PF) 4 MG/2 ML INJECTION SOLUTION
4.0000 mg | Freq: Once | INTRAMUSCULAR | Status: DC | PRN
Start: 2024-06-25 — End: 2024-06-25

## 2024-06-25 MED ORDER — FAMOTIDINE (PF) 20 MG/2 ML INTRAVENOUS SOLUTION
20.0000 mg | Freq: Once | INTRAVENOUS | Status: AC
Start: 2024-06-25 — End: 2024-06-25
  Administered 2024-06-25: 20 mg via INTRAVENOUS

## 2024-06-25 MED ORDER — ONDANSETRON HCL (PF) 4 MG/2 ML INJECTION SOLUTION
Freq: Once | INTRAMUSCULAR | Status: DC | PRN
Start: 2024-06-25 — End: 2024-06-25
  Administered 2024-06-25: 4 mg via INTRAVENOUS

## 2024-06-25 MED ORDER — ACETAMINOPHEN 325 MG TABLET
650.0000 mg | ORAL_TABLET | ORAL | Status: DC | PRN
Start: 2024-06-25 — End: 2024-06-25

## 2024-06-25 MED ORDER — DIBUCAINE 1 % TOPICAL OINTMENT
TOPICAL_OINTMENT | CUTANEOUS | Status: AC
Start: 2024-06-25 — End: 2024-06-25
  Filled 2024-06-25: qty 28

## 2024-06-25 MED ORDER — DIBUCAINE 1 % TOPICAL OINTMENT
TOPICAL_OINTMENT | Freq: Once | CUTANEOUS | Status: DC | PRN
Start: 2024-06-25 — End: 2024-06-25
  Administered 2024-06-25: 1 mL via TOPICAL

## 2024-06-25 MED ORDER — FAMOTIDINE (PF) 20 MG/2 ML INTRAVENOUS SOLUTION
INTRAVENOUS | Status: AC
Start: 2024-06-25 — End: 2024-06-25
  Filled 2024-06-25: qty 2

## 2024-06-25 MED ORDER — MIDAZOLAM 5 MG/ML INJECTION WRAPPER
INTRAMUSCULAR | Status: AC
Start: 2024-06-25 — End: 2024-06-25
  Filled 2024-06-25: qty 1

## 2024-06-25 MED ORDER — DIBUCAINE 1 % TOPICAL OINTMENT
TOPICAL_OINTMENT | Freq: Three times a day (TID) | CUTANEOUS | 3 refills | Status: AC
Start: 2024-06-25 — End: ?

## 2024-06-25 MED ORDER — SODIUM CHLORIDE 0.9 % (FLUSH) INJECTION SYRINGE
3.0000 mL | INJECTION | INTRAMUSCULAR | Status: DC | PRN
Start: 2024-06-25 — End: 2024-06-25

## 2024-06-25 MED ORDER — FENTANYL (PF) 50 MCG/ML INJECTION WRAPPER
INJECTION | Freq: Once | INTRAMUSCULAR | Status: DC | PRN
Start: 2024-06-25 — End: 2024-06-25
  Administered 2024-06-25: 25 ug via INTRAVENOUS
  Administered 2024-06-25: 50 ug via INTRAVENOUS
  Administered 2024-06-25: 25 ug via INTRAVENOUS

## 2024-06-25 MED ORDER — LACTATED RINGERS INTRAVENOUS SOLUTION
INTRAVENOUS | Status: DC
Start: 2024-06-25 — End: 2024-06-25

## 2024-06-25 MED ORDER — KETOROLAC 30 MG/ML (1 ML) INJECTION SOLUTION
Freq: Once | INTRAMUSCULAR | Status: DC | PRN
Start: 2024-06-25 — End: 2024-06-25
  Administered 2024-06-25: 30 mg via INTRAVENOUS

## 2024-06-25 MED ORDER — KETOROLAC 30 MG/ML (1 ML) INJECTION SOLUTION
15.0000 mg | Freq: Four times a day (QID) | INTRAMUSCULAR | Status: DC | PRN
Start: 2024-06-25 — End: 2024-06-25

## 2024-06-25 MED ORDER — PROPOFOL 10 MG/ML IV BOLUS
INJECTION | Freq: Once | INTRAVENOUS | Status: DC | PRN
Start: 2024-06-25 — End: 2024-06-25
  Administered 2024-06-25: 200 mg via INTRAVENOUS

## 2024-06-25 MED ORDER — DEXTROSE 5% IN WATER (D5W) FLUSH BAG - 250 ML
INTRAVENOUS | Status: DC | PRN
Start: 2024-06-25 — End: 2024-06-25

## 2024-06-25 MED ORDER — FENTANYL (PF) 50 MCG/ML INJECTION SOLUTION
INTRAMUSCULAR | Status: AC
Start: 2024-06-25 — End: 2024-06-25
  Filled 2024-06-25: qty 2

## 2024-06-25 MED ORDER — ONDANSETRON HCL (PF) 4 MG/2 ML INJECTION SOLUTION
4.0000 mg | Freq: Once | INTRAMUSCULAR | Status: AC
Start: 2024-06-25 — End: 2024-06-25
  Administered 2024-06-25: 4 mg via INTRAVENOUS

## 2024-06-25 MED ORDER — ONDANSETRON 4 MG DISINTEGRATING TABLET
4.0000 mg | ORAL_TABLET | Freq: Four times a day (QID) | ORAL | Status: DC | PRN
Start: 2024-06-25 — End: 2024-06-25

## 2024-06-25 MED ORDER — EPHEDRINE SULFATE 50 MG/ML INTRAVENOUS SOLUTION
Freq: Once | INTRAVENOUS | Status: DC | PRN
Start: 2024-06-25 — End: 2024-06-25
  Administered 2024-06-25 (×4): 5 mg via INTRAVENOUS

## 2024-06-25 MED ORDER — IPRATROPIUM 0.5 MG-ALBUTEROL 3 MG (2.5 MG BASE)/3 ML NEBULIZATION SOLN
3.0000 mL | INHALATION_SOLUTION | Freq: Once | RESPIRATORY_TRACT | Status: DC | PRN
Start: 2024-06-25 — End: 2024-06-25

## 2024-06-25 MED ORDER — DEXAMETHASONE SODIUM PHOSPHATE 4 MG/ML INJECTION SOLUTION
INTRAMUSCULAR | Status: AC
Start: 2024-06-25 — End: 2024-06-25
  Filled 2024-06-25: qty 1

## 2024-06-25 MED ORDER — ONDANSETRON 4 MG DISINTEGRATING TABLET
4.0000 mg | ORAL_TABLET | Freq: Three times a day (TID) | ORAL | 0 refills | Status: AC | PRN
Start: 2024-06-25 — End: ?

## 2024-06-25 MED ORDER — SODIUM CHLORIDE 0.9 % (FLUSH) INJECTION SYRINGE
3.0000 mL | INJECTION | Freq: Three times a day (TID) | INTRAMUSCULAR | Status: DC
Start: 2024-06-25 — End: 2024-06-25

## 2024-06-25 MED ORDER — CEFAZOLIN 2 GRAM INTRAVENOUS SOLUTION
INTRAVENOUS | Status: AC
Start: 2024-06-25 — End: 2024-06-25
  Filled 2024-06-25: qty 14.71

## 2024-06-25 MED ORDER — SODIUM CHLORIDE 0.9% FLUSH BAG - 250 ML
INTRAVENOUS | Status: DC | PRN
Start: 2024-06-25 — End: 2024-06-25

## 2024-06-25 MED ORDER — DEXAMETHASONE SODIUM PHOSPHATE 4 MG/ML INJECTION SOLUTION
Freq: Once | INTRAMUSCULAR | Status: DC | PRN
Start: 2024-06-25 — End: 2024-06-25
  Administered 2024-06-25: 4 mg via INTRAVENOUS

## 2024-06-25 MED ORDER — ETHYL ALCOHOL 62 % TOPICAL SWAB
1.0000 | Freq: Once | CUTANEOUS | Status: AC
Start: 2024-06-25 — End: 2024-06-25
  Administered 2024-06-25: 1 via NASAL

## 2024-06-25 MED ORDER — ROPIVACAINE (PF) 2 MG/ML (0.2 %) INJECTION SOLUTION
Freq: Once | INTRAMUSCULAR | Status: DC | PRN
Start: 2024-06-25 — End: 2024-06-25
  Administered 2024-06-25: 30 mL via INTRAMUSCULAR

## 2024-06-25 MED ORDER — NALOXONE 4 MG/ACTUATION NASAL SPRAY
1.0000 | NASAL | 0 refills | Status: AC | PRN
Start: 2024-06-25 — End: ?

## 2024-06-25 MED ORDER — PROCHLORPERAZINE EDISYLATE 10 MG/2 ML (5 MG/ML) INJECTION SOLUTION
5.0000 mg | Freq: Once | INTRAMUSCULAR | Status: DC | PRN
Start: 2024-06-25 — End: 2024-06-25

## 2024-06-25 MED ORDER — METOCLOPRAMIDE 5 MG/ML INJECTION SOLUTION
10.0000 mg | Freq: Four times a day (QID) | INTRAMUSCULAR | Status: DC | PRN
Start: 2024-06-25 — End: 2024-06-25

## 2024-06-25 MED ORDER — SCOPOLAMINE 1 MG OVER 3 DAYS TRANSDERMAL PATCH
1.0000 | MEDICATED_PATCH | Freq: Once | TRANSDERMAL | Status: AC
Start: 2024-06-25 — End: 2024-06-25
  Administered 2024-06-25: 1 via TRANSDERMAL

## 2024-06-25 MED ORDER — ACETAMINOPHEN 1,000 MG/100 ML (10 MG/ML) INTRAVENOUS SOLUTION
INTRAVENOUS | Status: AC
Start: 2024-06-25 — End: 2024-06-25
  Filled 2024-06-25: qty 100

## 2024-06-25 MED ORDER — ACETAMINOPHEN 1,000 MG/100 ML (10 MG/ML) INTRAVENOUS SOLUTION
Freq: Once | INTRAVENOUS | Status: DC | PRN
Start: 2024-06-25 — End: 2024-06-25
  Administered 2024-06-25 (×2): 500 mg via INTRAVENOUS

## 2024-06-25 MED ORDER — FENTANYL (PF) 50 MCG/ML INJECTION WRAPPER
25.0000 ug | INJECTION | INTRAMUSCULAR | Status: DC | PRN
Start: 2024-06-25 — End: 2024-06-25

## 2024-06-25 MED ORDER — SCOPOLAMINE 1 MG OVER 3 DAYS TRANSDERMAL PATCH
MEDICATED_PATCH | TRANSDERMAL | Status: AC
Start: 2024-06-25 — End: 2024-06-25
  Filled 2024-06-25: qty 1

## 2024-06-25 MED ORDER — HYDROCODONE 5 MG-ACETAMINOPHEN 325 MG TABLET
1.0000 | ORAL_TABLET | ORAL | Status: DC | PRN
Start: 2024-06-25 — End: 2024-06-25
  Administered 2024-06-25: 1 via ORAL
  Filled 2024-06-25: qty 1

## 2024-06-25 MED ORDER — SODIUM CHLORIDE 0.9 % INTRAVENOUS SOLUTION
INTRAVENOUS | Status: AC
Start: 2024-06-25 — End: 2024-06-25
  Filled 2024-06-25: qty 50

## 2024-06-25 MED ORDER — HYDROCODONE 7.5 MG-ACETAMINOPHEN 325 MG TABLET
1.0000 | ORAL_TABLET | Freq: Four times a day (QID) | ORAL | 0 refills | Status: AC | PRN
Start: 2024-06-25 — End: ?

## 2024-06-25 MED ORDER — FENTANYL (PF) 50 MCG/ML INJECTION WRAPPER
50.0000 ug | INJECTION | INTRAMUSCULAR | Status: DC | PRN
Start: 2024-06-25 — End: 2024-06-25

## 2024-06-25 MED ORDER — CEFAZOLIN 1 GRAM SOLUTION FOR INJECTION
Freq: Once | INTRAMUSCULAR | Status: DC | PRN
Start: 2024-06-25 — End: 2024-06-25
  Administered 2024-06-25: 2000 mg via INTRAVENOUS

## 2024-06-25 MED ORDER — ALBUTEROL SULFATE 2.5 MG/3 ML (0.083 %) SOLUTION FOR NEBULIZATION
2.5000 mg | INHALATION_SOLUTION | Freq: Once | RESPIRATORY_TRACT | Status: DC | PRN
Start: 2024-06-25 — End: 2024-06-25

## 2024-06-25 MED ORDER — HYDROMORPHONE 2 MG/ML INJECTION WRAPPER
0.2000 mg | INJECTION | INTRAMUSCULAR | Status: DC | PRN
Start: 2024-06-25 — End: 2024-06-25

## 2024-06-25 MED ORDER — ONDANSETRON HCL (PF) 4 MG/2 ML INJECTION SOLUTION
INTRAMUSCULAR | Status: AC
Start: 2024-06-25 — End: 2024-06-25
  Filled 2024-06-25: qty 2

## 2024-06-25 SURGICAL SUPPLY — 25 items
BLADE 15 2 END CBNSTL SURG STRL DISP (SURGICAL CUTTING SUPPLIES) ×2 IMPLANT
CLEANER INSTRUMENT PRE-KLENZ 13.5 OZ (MISCELLANEOUS PT CARE ITEMS) ×1 IMPLANT
COUNTER 20 CNT BLOCK ADH NEEDLE STRL LF  RD SHARP FOAM 15.75X11.5X14IN DISP (MED SURG SUPPLIES) ×1 IMPLANT
DSCT 21CM LIGASURE EXACT 40D 20.6MM 19.8MM 4MM CURVE JAW LOW TEMP PROF SURG 21.6X2MM VLAB (SURGICAL CUTTING SUPPLIES) ×1 IMPLANT
ELECTRODE PATIENT RTN 9FT VLAB C30- LB RM PHSV ACRL FOAM CORD NONIRRITATE NONSENSITIZE ADH STRP (SURGICAL CUTTING SUPPLIES) ×1 IMPLANT
GLOVE SURG 6.5 LF  PF BEAD CUF STRL CRM 11.3IN PROTEXIS PI PLISPRN THK9.1 MIL (GLOVES AND ACCESSORIES) ×1 IMPLANT
GLOVE SURG 6.5 LF  PF SMOOTH BEAD CUF INTLK STRL BLU 11.3IN PROTEXIS NEU-THERA PLISPRN THK7.9 MIL (GLOVES AND ACCESSORIES) ×1 IMPLANT
GLOVE SURG 7 LF  PF BEAD CUF STRL CRM 11.8IN PROTEXIS PI PLISPRN THK9.1 MIL (GLOVES AND ACCESSORIES) ×3 IMPLANT
GLOVE SURG 7 LF  PF SMOOTH BEAD CUF INTLK STRL BLU 11.8IN PROTEXIS NEU-THERA PLISPRN THK7.9 MIL (GLOVES AND ACCESSORIES) ×1 IMPLANT
GOWN SURG LRG STD LGTH L3 HKLP CLSR RGLN SLEEVE TWL STRL LF  DISP GRN AERO BLU PRFRM FBRC (DRAPE/PACKS/SHEETS/OR TOWEL) ×3 IMPLANT
GOWN SURG XL STD LGTH L3 HKLP CLSR RGLN SLEEVE TWL STRL LF  DISP GRN AERO BLU PRFRM FBRC (DRAPE/PACKS/SHEETS/OR TOWEL) ×1 IMPLANT
LABEL MED CORRECT MED LABELING SYS 4 FLG 2 SHEET 24 PRPRNT STRL (MED SURG SUPPLIES) ×1 IMPLANT
NEEDLE HYPO  18GA 1.5IN REG WL BD PRCSNGL POLYPROP REG BVL LL HUB CLR CD DEHP-FR STRL LF  DISP (MED SURG SUPPLIES) ×1 IMPLANT
NEEDLE HYPO  23GA 1.5IN TW PRCSNGL SS POLYPROP REG BVL LL HUB DEHP-FR TRQS STRL LF  DISP (MED SURG SUPPLIES) ×1 IMPLANT
PACK SURG SIRUS OB III REINF TBL CVR GRAD UNDERBUTTOCK STRL 76X44IN 46X33.5IN POLY LF (MED SURG SUPPLIES) ×1 IMPLANT
PAD ABDOMINAL 8X7.5IN LF  STRL (WOUND CARE SUPPLY) ×1 IMPLANT
SIGMOIDSCP RIGID 25CM 20MM KLNSPC 35V HAL GRAD OBTURATOR ILUM SYS DISP (SURGICAL INSTRUMENTS) ×1 IMPLANT
SOL IRRG 0.9% NACL 1000ML PLASTIC PR BTL ISTNC N-PYRG STRL LF (MEDICATIONS/SOLUTIONS) ×1 IMPLANT
SPONGE ABS 12.5X8CM PORCINE GELTN SRGFM THK10MM STRL DISP (WOUND CARE SUPPLY) ×1 IMPLANT
SPONGE GAUZE 4X4IN MDCHC COTTON 12 PLY TY 7 LF  STRL DISP (WOUND CARE SUPPLY) ×1 IMPLANT
SPONGE SURG 4X4IN 16 PLY XRY DETECT COTTON STRL LF  DISP (WOUND CARE SUPPLY) ×2 IMPLANT
SUTURE CHR 2-0 CT2 27IN BRN MONOF ABS (SUTURE/WOUND CLOSURE) ×2 IMPLANT
SYRINGE LL 10ML LF  STRL GRAD N-PYRG DEHP-FR PVC FREE MED DISP (MED SURG SUPPLIES) ×2 IMPLANT
TOWEL 24X16IN COTTON BLU DISP SURG STRL LF (DRAPE/PACKS/SHEETS/OR TOWEL) ×2 IMPLANT
TUBING SUCT CLR 6FT .25IN ARGYLE PVC NCDTV STR MALE FEMALE MLD CONN STRL LF (MED SURG SUPPLIES) ×1 IMPLANT

## 2024-06-25 NOTE — Nurses Notes (Signed)
 Bedside hand off given un:Imzjfj Sonda, RN  Vitals Signs as follows: Temp:97.52f                                        HR:67                                        Resp:17                                        BP:131/93                                        O2:100% room air  IV patent  Dressing Status:dressing assessed by receiving nurse and vital signs obtained.

## 2024-06-25 NOTE — Anesthesia Postprocedure Evaluation (Signed)
 Anesthesia Post Op Evaluation    Patient: Jennifer Lindsey  Procedure(s):  HEMORRHOIDECTOMY    Last Vitals:Temperature: 36.6 C (97.9 F) (06/25/24 0612)  Heart Rate: 64 (06/25/24 0612)  BP (Non-Invasive): 128/72 (06/25/24 0612)  Respiratory Rate: 18 (06/25/24 0612)  SpO2: 96 % (06/25/24 0612)    No notable events documented.    Patient is sufficiently recovered from the effects of anesthesia to participate in the evaluation and has returned to their pre-procedure level.  Patient location during evaluation: PACU       Patient participation: complete - patient participated  Level of consciousness: awake and alert and responsive to verbal stimuli    Pain management: adequate  Airway patency: patent    Anesthetic complications: no  Cardiovascular status: acceptable  Respiratory status: acceptable  Hydration status: acceptable  Patient post-procedure temperature: Pt Normothermic   PONV Status: Absent

## 2024-06-25 NOTE — OR Surgeon (Signed)
 [facility]      Patient Name: Jennifer Lindsey, Jennifer Lindsey Number: [id number]  Date of Service: 06/25/2024   Date of Birth: [birth year]      Pre-Operative Diagnosis: Hemorrhoids, unspecified hemorrhoid type [K64.9]     Post-Operative Diagnosis: Hemorrhoids, unspecified hemorrhoid type [K64.9]    Procedure(s)/Description:  HEMORRHOIDECTOMY: 46255 (CPT)       Findings: Hemorrhoids, unspecified hemorrhoid type [K64.9]     Attending Surgeon: Amaryllis KATHEE Denise, MD     Anesthesia:  Anesthesiologist: Cornett, Theadore Rigg, MD  CRNA: Croaston, Danette, CRNA    Anesthesia Type: .General     Estimated Blood Loss:  Minimal    The patient was placed in the lithotomy position.  After adequate anesthesia was provided, the perianal area was prepped and draped in a sterile fashion.  Adequate exposure was achieved and the prominent hemorrhoids were identified.  Local anesthetic was infiltrated in the perianal tissue.  Looking at the anal opening, the 2 largest cluster so prominent hemorrhoids were at the 5 and 7 o'clock position.  That has a smaller cluster at the 12 o'clock position which was not as prominent.  It was decided to plan to remove the clusters at 5 and 7.  Anal retractor was used to expose the area.  Using an Allis clamp, the cluster at 5 was grasped and an incision was made in the perianal skin.  Using the LigaSure submucosal and some plexus dissection was carried out to excise the hemorrhoid.  Hemostasis was well obtained.  The mucosa was approximated with a running 2-0 chromic suture.  Attention was then turned to the cluster at 7 which was likewise graft.  An incision was made in the skin and then dissection in the submucosal so plexus plane was then performed using the LigaSure to excise this cluster hemorrhoids.  The mucosa was reapproximated with a running 2-0 chromic suture.        Hemostasis was well obtained. The operative site was covered with a Nupercainal impregnated gauze dressing followed by  additional gauze dressings and a peripad.  The patient tolerated the procedure well. No complications were encountered. The patient was taken to the recovery area in stable condition.    Nera Haworth B. Adjoa Althouse, MD, MBA, FACS  [facility] -General Surgery

## 2024-06-25 NOTE — Discharge Instructions (Signed)
 Apply peri pad to rectal area daily and as needed for drainage and to protect surgical area.    Resume home meds.    RX for home sent in to your pharmacy.    Follow instructions given by Dr Marlyce and by what is ordered on AVS report.  That area will be highlighted.

## 2024-06-25 NOTE — Anesthesia Preprocedure Evaluation (Signed)
 ANESTHESIA PRE-OP EVALUATION  Planned Procedure: HEMORRHOIDECTOMY (Anus)  Review of Systems     anesthesia history negative     patient summary reviewed  nursing notes reviewed        Pulmonary  negative pulmonary ROS,    Cardiovascular    Hypertension, well controlled and ECG reviewed ,No peripheral edema,  Exercise Tolerance: > or = 4 METS        GI/Hepatic/Renal    motion sickness and kidney stones        Endo/Other    hypothyroidism,      Neuro/Psych/MS    back abnormality, anxiety     Cancer    negative hematology/oncology ROS,                   Physical Assessment      Airway       Mallampati: II    TM distance: >3 FB    Neck ROM: full  Mouth Opening: good.            Dental       Dentition intact             Pulmonary    Breath sounds clear to auscultation  (-) no rhonchi, no decreased breath sounds, no wheezes, no rales and no stridor     Cardiovascular    Rhythm: regular  Rate: Normal  (-) no friction rub, carotid bruit is not present, no peripheral edema and no murmur     Other findings            Plan  ASA 2     Planned anesthesia type: general           PONV Plan:  I plan to administer pharmcologic prophalaxis antiemetics                  Anesthesia issues/risks discussed are: Dental Injuries, Stroke, Aspiration, Cardiac Events/MI, Intraoperative Awareness/ Recall and Sore Throat.  Anesthetic plan and risks discussed with patient  signed consent obtained          Patient's NPO status is appropriate for Anesthesia.           Plan discussed with CRNA.    (LMA ok if positioning allows)        Urine Pregnancy Results:  (Urine sent)

## 2024-06-25 NOTE — Anesthesia Transfer of Care (Signed)
 ANESTHESIA TRANSFER OF CARE   Jennifer Lindsey is a 38 y.o. ,female, Weight: 61.2 kg (135 lb)   had Procedure(s):  HEMORRHOIDECTOMY  performed  06/25/24   Primary Service: Amaryllis KATHEE Denise, MD    Past Medical History:   Diagnosis Date   . Anxiety    . Arthritis    . Back problem    . Chronic pain    . Clotting disorder     mtfhr   . Essential hypertension    . Hypothyroid    . Kidney stones    . Motion sickness    . Systemic lupus erythematosus    . Wears glasses       Allergy History as of 06/25/24       PENICILLINS         Noted Status Severity Type Reaction    06/22/24 0952 Silva Stabs, RN 12/18/08 Active Medium  Rash    06/22/24 0952 Silva Stabs, RN 12/18/08 Active Medium  Rash,  Other Adverse Reaction (Add comment)    12/18/08 Haddix, [place], MA 12/18/08 Active   Rash              ESTROGENS         Noted Status Severity Type Reaction    06/22/24 0952 Silva Stabs, RN 12/18/08 Active    Other Adverse Reaction (Add comment)    Comments: Really high BP    Estrogen-containing oral contraceptives exacerbate her hypertension      Really high BP     06/22/24 0952 Silva Stabs, RN 12/18/08 Active High   Other Adverse Reaction (Add comment)    Comments: Really high BP    Estrogen-containing oral contraceptives exacerbate her hypertension      Really high BP     12/18/08 Haddix, [place]ga, MA 12/18/08 Active High   Other Adverse Reaction (Add comment)    Comments: Really high BP                   I completed my transfer of care / handoff to the receiving personnel during which we discussed:  Access, Airway, All key/critical aspects of case discussed, Analgesia, Antibiotics, Expectation of post procedure, Fluids/Product, Gave opportunity for questions and acknowledgement of understanding, Labs and PMHx      Post Location: PACU                                                           Last OR Temp: Temperature: 36.1 C (97 F)      Airway:  LMA (Active)     Blood pressure 133/89, pulse (!) 109, temperature  36.1 C (97 F), resp. rate 16, height 1.651 m (5' 5), weight 61.2 kg (135 lb), SpO2 100%.

## 2024-06-25 NOTE — H&P (Signed)
 [facility]  General Surgery  History and Physical    Date of Service:  06/25/2024  Jennifer Lindsey, Jennifer Lindsey, 38 y.o. female  Date of Admission:  06/25/2024  Date of Birth:  Aug 16, 1986  PCP: Bernardino Ruff, DO    Reason for admission:  hemorrhoidectomy    HPI:  Jennifer Lindsey is a 38 y.o. White female who is admitted for Hemorrhoids, unspecified hemorrhoid type [K64.9] . The patient has had a long standing history of hemorrhoid problems which intermittently swells,becomes inflamed and bleeds. She presents for hemorrhoidectomy. She had a flex sigmoid on 05/11/23 which showed grade 2 hemorrhoids as well as prominent external hemorrhoidal skin.    Past Medical History:   Diagnosis Date    Anxiety     Arthritis     Back problem     Chronic pain     Clotting disorder     mtfhr    Essential hypertension     Hypothyroid     Kidney stones     Motion sickness     Systemic lupus erythematosus     Wears glasses       Past Surgical History:   Procedure Laterality Date    CESAREAN SECTION      HX ENDOMETRIAL ABLATION      HX PELVIC LAPAROSCOPY      LAPAROSCOPIC CHOLECYSTECTOMY        Social History[1]    Family Medical History:       Problem Relation (Age of Onset)    No Known Problems Mother, Father, Sister, Brother, Maternal Grandmother, Maternal Grandfather, Paternal Grandmother, Paternal Grandfather, Daughter, Son, Maternal Aunt, Maternal Uncle, Paternal Aunt, Paternal Uncle, Other           Medications Prior to Admission       Prescriptions    busPIRone  (BUSPAR ) 10 mg Oral Tablet    Take one tablet by mouth daily    citalopram  (CELEXA ) 40 mg Oral Tablet    Take one tablet by mouth daily    cyanocobalamin  (VITAMIN B12) 1,000 mcg/mL Injection Solution    Inject 1mL IM once a month    drospirenone, contraceptive, (SLYND) 4 mg (28) Oral Tablet    Take 1 Tablet (4 mg total) by mouth Daily    Patient not taking:  Reported on 06/25/2024    hydroxychloroquine (PLAQUENIL) 200 mg Oral Tablet    Take 1 Tablet (200 mg total) by  mouth Daily    Patient not taking:  Reported on 06/25/2024    hydrOXYzine HCL (ATARAX) 10 mg Oral Tablet    Take 1 Tablet (10 mg total) by mouth Once per day as needed    Patient not taking:  Reported on 06/25/2024    inulin (FIBER GUMMIES ORAL)    Take 5 Tablets by mouth Twice daily    levothyroxine  (SYNTHROID) 112 mcg Oral Tablet    Take 1 Tablet (112 mcg total) by mouth Every morning    liothyronine  (CYTOMEL ) 5 mcg Oral Tablet    TAKE (2) TABLETS ONCE A DAY    losartan (COZAAR) 25 mg Oral Tablet    Take 1 Tablet (25 mg total) by mouth Daily    minoxidiL (LONITEN) 2.5 mg Oral Tablet    Take 1 Tablet (2.5 mg total) by mouth Daily    multivit-minerals/folic acid (CENTRUM ADULTS ORAL)    Take 1 Tablet by mouth Daily    ondansetron  (ZOFRAN  ODT) 8 mg Oral Tablet, Rapid Dissolve    Place 1 Tablet (8  mg total) under the tongue Every 8 hours as needed for Nausea/Vomiting    traMADoL (ULTRAM) 50 mg Oral Tablet    Take 1 Tablet (50 mg total) by mouth Every 6 hours as needed    Tretinoin  (RETIN-A ) 0.05 % Cream    Apply pea size amount to face nightly as tolerated           Allergies[2]       Patient Vitals for the past 24 hrs:   BP Temp Pulse Resp SpO2 Height Weight   06/25/24 0612 128/72 36.6 C (97.9 F) 64 18 96 % 1.651 m (5' 5) 61.2 kg (135 lb)          General: appropriate for age. in no acute distress.    Vital signs are present above and have been reviewed by me     HEENT: Atraumatic, Normocephalic. PERRLA, EOMI. Nose clear. Throat clear.    Lungs: Nonlabored breathing with symmetric expansion.  Clear to auscultation bilaterally    Heart:Regular wth respect to rate and rythmn.    Abdomen:Soft. Nontender. Nondistended and benign    Grade 2 hemorrhoids with prominent external hemorrhoidal skin.    Extremities:  Grossly normal with good range of motion and no major deformities.    Neuro:  Grossly normal motor and sensory function. CN's II through XII intact.    Psychiatric: Alert and oriented to person, place, and time.  affect appropriate    Laboratory Data:     Results for orders placed or performed during the hospital encounter of 06/25/24 (from the past 24 hours)   HCG, URINE QUALITATIVE, PREGNANCY   Result Value Ref Range    HCG URINE QUALITATIVE Negative        Imaging Studies:    No orders to display        Assessment/Plan:  Hemorrhoids, unspecified hemorrhoid type [K64.9]    Hemorrhoidectomy scheduled for Monday June 25, 2024     Risks, benefits, indications, complications of elective excision of these hemorrhoids (s) were discussed in detail including but not limited to bleeding, infection, reaction to anesthesia, need for further surgery and non-cosmetic result of the scar.   All questions were answered to the patient`s satisfaction with informed consent clearly obtained.      This note was partially created using voice recognition software and is inherently subject to errors including those of syntax and sound alike  substitutions which may escape proof reading. In such instances, original meaning may be extrapolated by contextual derivation.    Brydon Spahr B Lezette Kitts, MD, MBA, FACS         [1]   Social History  Tobacco Use    Smoking status: Never    Smokeless tobacco: Never   Vaping Use    Vaping status: Never Used   Substance Use Topics    Alcohol use: No    Drug use: Never   [2]   Allergies  Allergen Reactions    Penicillins Rash    Estrogens  Other Adverse Reaction (Add comment)     Really high BP    Estrogen-containing oral contraceptives exacerbate her hypertension      Really high BP

## 2024-06-26 ENCOUNTER — Telehealth (INDEPENDENT_AMBULATORY_CARE_PROVIDER_SITE_OTHER): Payer: Self-pay | Admitting: Surgery

## 2024-06-26 LAB — SURGICAL PATHOLOGY SPECIMEN

## 2024-07-10 ENCOUNTER — Other Ambulatory Visit: Payer: Self-pay

## 2024-07-10 ENCOUNTER — Ambulatory Visit (INDEPENDENT_AMBULATORY_CARE_PROVIDER_SITE_OTHER): Payer: Self-pay | Admitting: Surgery

## 2024-07-10 ENCOUNTER — Encounter (INDEPENDENT_AMBULATORY_CARE_PROVIDER_SITE_OTHER): Payer: Self-pay | Admitting: Surgery

## 2024-07-10 VITALS — BP 119/87 | HR 68 | Temp 97.8°F | Resp 18 | Ht 65.0 in | Wt 139.0 lb

## 2024-07-10 DIAGNOSIS — Z9889 Other specified postprocedural states: Secondary | ICD-10-CM

## 2024-07-11 ENCOUNTER — Encounter (INDEPENDENT_AMBULATORY_CARE_PROVIDER_SITE_OTHER): Payer: Self-pay | Admitting: Surgery

## 2024-07-11 NOTE — Progress Notes (Signed)
 GENERAL SURGERY, [facility]  [address]  [place] [address]    Post-Op/Procedure Note    Name: ALBENA COMES MRN:  [id number]   Date:   07/10/2024 DOB:  1986-08-10 (38 y.o.)               Subjective:  Berwyn JONELLE Hanger presents 3 weeks following a excisional hemorrhoidectomy.   She is eating a regular diet without difficulty.  No nausea, vomiting, or abdominal pain.  Bowel movements are Normal.    The patient is not having any pain..  No fevers.     Objective:  BP 119/87   Pulse 68   Temp 36.6 C (97.8 F)   Resp 18   Ht 1.651 m (5' 5)   Wt 63 kg (139 lb)   SpO2 100%   BMI 23.13 kg/m       General:  alert, cooperative, no distress, appears stated age  Abdomen: soft  Incision:  healing well    Path report:  Hemorrhoids    Assessment/Plan:  Doing well postoperatively.  Activity: Pt is to increase activities as tolerated.  The patient will continue good local care as she has been doing.  Return if symptoms worsen or fail to improve.    Amaryllis KATHEE Denise, MD

## 2024-08-22 ENCOUNTER — Ambulatory Visit: Attending: FAMILY PRACTICE

## 2024-08-22 DIAGNOSIS — E559 Vitamin D deficiency, unspecified: Secondary | ICD-10-CM

## 2024-08-22 DIAGNOSIS — K2100 Gastro-esophageal reflux disease with esophagitis, without bleeding: Secondary | ICD-10-CM

## 2024-08-22 DIAGNOSIS — E785 Hyperlipidemia, unspecified: Secondary | ICD-10-CM | POA: Insufficient documentation

## 2024-08-22 DIAGNOSIS — E538 Deficiency of other specified B group vitamins: Secondary | ICD-10-CM

## 2024-08-22 LAB — COMPREHENSIVE METABOLIC PANEL, NON-FASTING
ALBUMIN/GLOBULIN RATIO: 1.7 — ABNORMAL HIGH (ref 0.8–1.4)
ALBUMIN: 4.3 g/dL (ref 3.5–5.7)
ALKALINE PHOSPHATASE: 42 U/L (ref 34–104)
ALT (SGPT): 10 U/L (ref 7–52)
ANION GAP: 6 mmol/L (ref 4–13)
AST (SGOT): 14 U/L (ref 13–39)
BILIRUBIN TOTAL: 0.4 mg/dL (ref 0.3–1.0)
BUN/CREA RATIO: 21 (ref 6–22)
BUN: 18 mg/dL (ref 7–25)
CALCIUM, CORRECTED: 8.6 mg/dL — ABNORMAL LOW (ref 8.9–10.8)
CALCIUM: 8.8 mg/dL (ref 8.6–10.3)
CHLORIDE: 106 mmol/L (ref 98–107)
CO2 TOTAL: 25 mmol/L (ref 21–31)
CREATININE: 0.84 mg/dL (ref 0.60–1.30)
ESTIMATED GFR: 90 mL/min/1.73mˆ2 (ref >59)
GLOBULIN: 2.5 (ref 2.0–3.5)
GLUCOSE: 154 mg/dL — ABNORMAL HIGH (ref 74–109)
OSMOLALITY, CALCULATED: 279 mosm/kg (ref 270–290)
POTASSIUM: 4 mmol/L (ref 3.5–5.1)
PROTEIN TOTAL: 6.8 g/dL (ref 6.4–8.9)
SODIUM: 137 mmol/L (ref 136–145)

## 2024-08-22 LAB — CBC
HCT: 38.4 % (ref 31.2–41.9)
HGB: 13.3 g/dL (ref 10.9–14.3)
MCH: 29.8 pg (ref 24.7–32.8)
MCHC: 34.6 g/dL (ref 32.3–35.6)
MCV: 86.1 fL (ref 75.5–95.3)
MPV: 8.5 fL (ref 7.9–10.8)
PLATELETS: 249 x10ˆ3/uL (ref 140–440)
RBC: 4.46 x10ˆ6/uL (ref 3.63–4.92)
RDW: 13.1 % (ref 12.3–17.7)
WBC: 9.5 x10ˆ3/uL (ref 3.8–11.8)

## 2024-08-22 LAB — VITAMIN B12: VITAMIN B 12: 398 pg/mL (ref 180–914)

## 2024-08-22 LAB — THYROXINE, FREE (FREE T4): THYROXINE (T4), FREE: 0.82 ng/dL (ref 0.61–1.12)

## 2024-08-22 LAB — VITAMIN D 25 TOTAL: VITAMIN D 25, TOTAL: 32.53 ng/mL (ref 30.00–100.00)

## 2024-08-22 LAB — THYROID STIMULATING HORMONE (SENSITIVE TSH): TSH: 0.102 u[IU]/mL — ABNORMAL LOW (ref 0.450–5.330)
